# Patient Record
Sex: Female | Born: 2000 | Race: Black or African American | Hispanic: No | Marital: Single | Smoking: Never smoker
Health system: Southern US, Community
[De-identification: ages and names within clinical notes are randomized; demographics above are authoritative.]

## PROBLEM LIST (undated history)

## (undated) DIAGNOSIS — I340 Nonrheumatic mitral (valve) insufficiency: Secondary | ICD-10-CM

## (undated) DIAGNOSIS — I341 Nonrheumatic mitral (valve) prolapse: Secondary | ICD-10-CM

## (undated) HISTORY — DX: Nonrheumatic mitral (valve) prolapse: I34.1

## (undated) HISTORY — PX: OTHER SURGICAL HISTORY: SHX169

## (undated) HISTORY — DX: Nonrheumatic mitral (valve) insufficiency: I34.0

---
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
## 2019-05-09 ENCOUNTER — Ambulatory Visit (HOSPITAL_COMMUNITY)
Admission: EM | Admit: 2019-05-09 | Discharge: 2019-05-09 | Disposition: A | Discharge status: Home / Self Care | Payer: Medicaid Other | Attending: Emergency Medicine | Admitting: Emergency Medicine

## 2019-05-09 ENCOUNTER — Other Ambulatory Visit: Payer: Self-pay

## 2019-05-09 ENCOUNTER — Encounter (HOSPITAL_COMMUNITY): Payer: Self-pay

## 2019-05-09 DIAGNOSIS — Z20828 Contact with and (suspected) exposure to other viral communicable diseases: Secondary | ICD-10-CM

## 2019-05-09 DIAGNOSIS — Z20822 Contact with and (suspected) exposure to covid-19: Secondary | ICD-10-CM

## 2019-05-09 NOTE — ED Provider Notes (Signed)
[place]    CSN: [id number] Arrival date & time: 05/09/19  1403      History   Chief Complaint Chief Complaint  Patient presents with  . covid test    HPI Anita Salazar is a 18 y.o. female.   53 y old female presents with a complaint of being exposed to COVID-19.  Patient stated her boyfriend tested positive. Cannot recall the date her boyfriend tested positive.  She is asymptomatic and wants to be tested for COVID-1 9.   Denies sick exposure to  flu or strep.  Denies recent travel.  Denies aggravating or alleviating symptoms.  Denies previous COVID infection.   Denies fever, chills, fatigue, nasal congestion, rhinorrhea, sore throat, cough, SOB, wheezing, chest pain, nausea, vomiting, changes in bowel or bladder habits.    The history is provided by the patient. A language interpreter was used.    History reviewed. No pertinent past medical history.  There are no active problems to display for this patient.   History reviewed. No pertinent surgical history.  OB History   No obstetric history on file.      Home Medications    Prior to Admission medications   Medication Sig Start Date End Date Taking? Authorizing Provider  diphenhydrAMINE (BENADRYL) 25 MG tablet Take 1 tablet (25 mg total) by mouth every 6 (six) hours. Patient not taking: Reported on 12/28/2014 12/21/13   Louanne Skye, MD  ibuprofen (ADVIL,MOTRIN) 200 MG tablet Take 400 mg by mouth every 6 (six) hours as needed for headache, mild pain, moderate pain or cramping.    [provider]  nystatin-triamcinolone (MYCOLOG II) cream Apply to affected area daily Patient not taking: Reported on 11/10/2015 12/28/14   Antonietta Breach, PA-C  phenazopyridine (PYRIDIUM) 95 MG tablet Take 1 tablet (95 mg total) by mouth 3 (three) times daily as needed for pain. 03/03/17   Archer Asa, NP    Family History History reviewed. No pertinent family history.  Social History Social History    Tobacco Use  . Smoking status: Never Smoker  . Smokeless tobacco: Never Used  Substance Use Topics  . Alcohol use: No  . Drug use: No     Allergies   Patient has no known allergies.   Review of Systems Review of Systems  Constitutional: Negative for activity change, chills, fatigue and fever.  HENT: Negative for congestion, ear discharge, sinus pressure, sinus pain and sore throat.   Respiratory: Negative for cough, chest tightness, shortness of breath and wheezing.   Cardiovascular: Negative for chest pain and palpitations.  Gastrointestinal: Negative for constipation, diarrhea, nausea and vomiting.  Neurological: Negative for headaches.  ROS:All other are negatives Physical Exam Triage Vital Signs ED Triage Vitals  Enc Vitals Group     BP 05/09/19 1444 116/66     Pulse Rate 05/09/19 1444 78     Resp 05/09/19 1444 15     Temp 05/09/19 1444 98.4 F (36.9 C)     Temp Source 05/09/19 1444 Oral     SpO2 05/09/19 1444 100 %     Weight --      Height --      Head Circumference --      Peak Flow --      Pain Score 05/09/19 1441 0     Pain Loc --      Pain Edu? --      Excl. in [place]? --    No data found.  Updated Vital Signs  BP 116/66 (BP Location: Right Arm)   Pulse 78   Temp 98.4 F (36.9 C) (Oral)   Resp 15   SpO2 100%   Visual Acuity Right Eye Distance:   Left Eye Distance:   Bilateral Distance:    Right Eye Near:   Left Eye Near:    Bilateral Near:     Physical Exam Vitals signs and nursing note reviewed.  Constitutional:      General: She is not in acute distress.    Appearance: Normal appearance. She is normal weight. She is not ill-appearing.  HENT:     Head: Normocephalic.     Right Ear: Tympanic membrane, ear canal and external ear normal. There is no impacted cerumen.     Left Ear: Tympanic membrane, ear canal and external ear normal. There is no impacted cerumen.     Nose: No congestion.     Mouth/Throat:     Mouth: Mucous membranes are  moist.     Pharynx: No oropharyngeal exudate or posterior oropharyngeal erythema.  Cardiovascular:     Rate and Rhythm: Normal rate and regular rhythm.     Heart sounds: Normal heart sounds. No murmur.  Pulmonary:     Effort: Pulmonary effort is normal. No respiratory distress.     Breath sounds: Normal breath sounds. No wheezing.  Chest:     Chest wall: No tenderness.  Neurological:     Mental Status: She is alert.      UC Treatments / Results  Labs (all labs ordered are listed, but only abnormal results are displayed) Labs Reviewed  NOVEL CORONAVIRUS, NAA (HOSP ORDER, SEND-OUT TO REF LAB; TAT 18-24 HRS)    EKG   Radiology No results found.  Procedures Procedures (including critical care time)  Medications Ordered in UC Medications - No data to display  Initial Impression / Assessment and Plan / UC Course  I have reviewed the triage vital signs and the nursing notes.  Pertinent labs & imaging results that were available during my care of the patient were reviewed by me and considered in my medical decision making (see chart for details).    Patient is a symptomatic and stable at discharge.  COVID-19 test was ordered. Patient will be called when results become available and positive.To return to the urgent care if symptoms get worse. She was advised to follow-up with her primary care provider Final Clinical Impressions(s) / UC Diagnoses   Final diagnoses:  Exposure to COVID-19 virus     Discharge Instructions     COVID testing ordered.  It will take between 2-7 days for test results.  Someone will contact you regarding abnormal results.    In the meantime: You should remain isolated in your home for 10 days from symptom onset AND greater than 72 hours after symptoms resolution (absence of fever without the use of fever-reducing medication and improvement in respiratory symptoms), whichever is longer Get plenty of rest and push fluids Use medications daily for  symptom relief Use OTC medications like ibuprofen or tylenol as needed fever or pain Call or go to the ED if you have any new or worsening symptoms such as fever, worsening cough, shortness of breath, chest tightness, chest pain, turning blue, changes in mental status, etc...    ED Prescriptions    None     PDMP not reviewed this encounter.   Durward Parcel, FNP 05/09/19 1506

## 2019-05-09 NOTE — ED Triage Notes (Signed)
Pt presents to the UC for Covid test. Pt states her boyfriend is feeling sick and she is concern of him having Covid. Pt denies any signs and symptoms.

## 2019-05-09 NOTE — Discharge Instructions (Addendum)

## 2019-05-11 LAB — NOVEL CORONAVIRUS, NAA (HOSP ORDER, SEND-OUT TO REF LAB; TAT 18-24 HRS): SARS-CoV-2, NAA: NOT DETECTED

## 2019-12-04 DIAGNOSIS — Z419 Encounter for procedure for purposes other than remedying health state, unspecified: Secondary | ICD-10-CM | POA: Diagnosis not present

## 2020-01-04 DIAGNOSIS — Z419 Encounter for procedure for purposes other than remedying health state, unspecified: Secondary | ICD-10-CM | POA: Diagnosis not present

## 2020-02-04 DIAGNOSIS — Z419 Encounter for procedure for purposes other than remedying health state, unspecified: Secondary | ICD-10-CM | POA: Diagnosis not present

## 2020-02-06 ENCOUNTER — Other Ambulatory Visit: Payer: Self-pay

## 2020-02-06 ENCOUNTER — Encounter (HOSPITAL_COMMUNITY): Payer: Self-pay | Admitting: Emergency Medicine

## 2020-02-06 ENCOUNTER — Ambulatory Visit (HOSPITAL_COMMUNITY)
Admission: EM | Admit: 2020-02-06 | Discharge: 2020-02-06 | Disposition: A | Discharge status: Home / Self Care | Payer: Medicaid Other | Attending: Internal Medicine | Admitting: Internal Medicine

## 2020-02-06 DIAGNOSIS — Z3202 Encounter for pregnancy test, result negative: Secondary | ICD-10-CM | POA: Diagnosis not present

## 2020-02-06 DIAGNOSIS — R112 Nausea with vomiting, unspecified: Secondary | ICD-10-CM

## 2020-02-06 DIAGNOSIS — Z20822 Contact with and (suspected) exposure to covid-19: Secondary | ICD-10-CM | POA: Insufficient documentation

## 2020-02-06 LAB — POC URINE PREG, ED: Preg Test, Ur: NEGATIVE

## 2020-02-06 MED ORDER — ONDANSETRON HCL 4 MG PO TABS: 4.0000 mg | ORAL_TABLET | Freq: Four times a day (QID) | ORAL | 0 refills | Status: DC | PRN

## 2020-02-06 NOTE — ED Provider Notes (Signed)
[facility]    CSN: [id number] Arrival date & time: 02/06/20  1845      History   Chief Complaint Chief Complaint  Patient presents with  . Emesis    HPI Anita Salazar is a 19 y.o. female.   Patient presents with 1 episode of emesis today which occurred at lunchtime after she had eaten.  She states she was having a bowel movement when she felt hot and then vomited once.  She has been able to drink tea since then without emesis.  She denies fever, chills, sore throat, cough, shortness of breath, abdominal pain, diarrhea, or other symptoms.  She states she was sent home from work and is required to have a COVID test before she can return.  The history is provided by the patient.    History reviewed. No pertinent past medical history.  There are no problems to display for this patient.   History reviewed. No pertinent surgical history.  OB History   No obstetric history on file.      Home Medications    Prior to Admission medications   Medication Sig Start Date End Date Taking? Authorizing Provider  diphenhydrAMINE (BENADRYL) 25 MG tablet Take 1 tablet (25 mg total) by mouth every 6 (six) hours. Patient not taking: Reported on 12/28/2014 12/21/13   Niel Hummer, MD  ibuprofen (ADVIL,MOTRIN) 200 MG tablet Take 400 mg by mouth every 6 (six) hours as needed for headache, mild pain, moderate pain or cramping.    [provider]  nystatin-triamcinolone (MYCOLOG II) cream Apply to affected area daily Patient not taking: Reported on 11/10/2015 12/28/14   Antony Madura, PA-C  ondansetron (ZOFRAN) 4 MG tablet Take 1 tablet (4 mg total) by mouth every 6 (six) hours as needed for nausea or vomiting. 02/06/20   Mickie Bail, NP  phenazopyridine (PYRIDIUM) 95 MG tablet Take 1 tablet (95 mg total) by mouth 3 (three) times daily as needed for pain. 03/03/17   Cato Mulligan, NP    Family History History reviewed. No pertinent family history.  Social History Social  History   Tobacco Use  . Smoking status: Never Smoker  . Smokeless tobacco: Never Used  Substance Use Topics  . Alcohol use: No  . Drug use: No     Allergies   Patient has no known allergies.   Review of Systems Review of Systems  Constitutional: Negative for chills and fever.  HENT: Negative for ear pain and sore throat.   Eyes: Negative for pain and visual disturbance.  Respiratory: Negative for cough and shortness of breath.   Cardiovascular: Negative for chest pain and palpitations.  Gastrointestinal: Positive for nausea and vomiting. Negative for abdominal pain, constipation and diarrhea.  Genitourinary: Negative for dysuria and hematuria.  Musculoskeletal: Negative for arthralgias and back pain.  Skin: Negative for color change and rash.  Neurological: Negative for seizures and syncope.  All other systems reviewed and are negative.    Physical Exam Triage Vital Signs ED Triage Vitals  Enc Vitals Group     BP      Pulse      Resp      Temp      Temp src      SpO2      Weight      Height      Head Circumference      Peak Flow      Pain Score      Pain Loc  Pain Edu?      Excl. in [place]?    No data found.  Updated Vital Signs BP (!) 148/76 (BP Location: Right Arm)   Pulse 90   Temp 98.7 F (37.1 C) (Oral)   Resp 16   SpO2 100%   Visual Acuity Right Eye Distance:   Left Eye Distance:   Bilateral Distance:    Right Eye Near:   Left Eye Near:    Bilateral Near:     Physical Exam Vitals and nursing note reviewed.  Constitutional:      General: She is not in acute distress.    Appearance: She is well-developed. She is not ill-appearing.  HENT:     Head: Normocephalic and atraumatic.     Mouth/Throat:     Mouth: Mucous membranes are moist.     Pharynx: Oropharynx is clear.  Eyes:     Conjunctiva/sclera: Conjunctivae normal.  Cardiovascular:     Rate and Rhythm: Normal rate and regular rhythm.     Heart sounds: No murmur heard.    Pulmonary:     Effort: Pulmonary effort is normal. No respiratory distress.     Breath sounds: Normal breath sounds.  Abdominal:     General: Bowel sounds are normal.     Palpations: Abdomen is soft.     Tenderness: There is no abdominal tenderness. There is no right CVA tenderness, left CVA tenderness, guarding or rebound.  Musculoskeletal:     Cervical back: Neck supple.  Skin:    General: Skin is warm and dry.     Findings: No rash.  Neurological:     General: No focal deficit present.     Mental Status: She is alert and oriented to person, place, and time.     Gait: Gait normal.  Psychiatric:        Mood and Affect: Mood normal.        Behavior: Behavior normal.      UC Treatments / Results  Labs (all labs ordered are listed, but only abnormal results are displayed) Labs Reviewed  SARS CORONAVIRUS 2 (TAT 6-24 HRS)  POC URINE PREG, ED    EKG   Radiology No results found.  Procedures Procedures (including critical care time)  Medications Ordered in UC Medications - No data to display  Initial Impression / Assessment and Plan / UC Course  I have reviewed the triage vital signs and the nursing notes.  Pertinent labs & imaging results that were available during my care of the patient were reviewed by me and considered in my medical decision making (see chart for details).   Nausea with vomiting, not intractable.  Treating with Zofran.  Instructed patient to keep herself hydrated with clear liquids.  PCR COVID pending.  Instructed her to self quarantine until the test result is back.  Discussed that she should go to the ED if she has acute worsening symptoms and follow-up with her PCP as needed.  Patient agrees to plan of care.   Final Clinical Impressions(s) / UC Diagnoses   Final diagnoses:  Non-intractable vomiting with nausea, unspecified vomiting type     Discharge Instructions     Take the antinausea medication as directed.    Keep yourself  hydrated with clear liquids, such as water, Gatorade, Pedialyte, Sprite, or ginger ale.    Go to the emergency department if you have acute worsening symptoms.    Follow up with your primary care provider if your symptoms are not improving.  Your COVID test is pending.  You should self quarantine until the test result is back.         ED Prescriptions    Medication Sig Dispense Auth. Provider   ondansetron (ZOFRAN) 4 MG tablet Take 1 tablet (4 mg total) by mouth every 6 (six) hours as needed for nausea or vomiting. 12 tablet Mickie Bail, NP     PDMP not reviewed this encounter.   Mickie Bail, NP 02/06/20 2032

## 2020-02-06 NOTE — ED Triage Notes (Signed)
Patient presents to [facility] for assessment after she ate a honey bun and "noodles" for lunch today, felt like she needed to have a bowel movement, went to the bathroom and was having a bowel movement, got hot and felt bad and vomited.  States she gagged twice.  Denies any episodes since, denies abdominal pain since, denies URI symptoms.

## 2020-02-06 NOTE — Discharge Instructions (Signed)
Take the antinausea medication as directed.    Keep yourself hydrated with clear liquids, such as water, Gatorade, Pedialyte, Sprite, or ginger ale.    Go to the emergency department if you have acute worsening symptoms.    Follow up with your primary care provider if your symptoms are not improving.    Your COVID test is pending.  You should self quarantine until the test result is back.

## 2020-02-07 LAB — SARS CORONAVIRUS 2 (TAT 6-24 HRS): SARS Coronavirus 2: NEGATIVE

## 2020-03-05 DIAGNOSIS — Z419 Encounter for procedure for purposes other than remedying health state, unspecified: Secondary | ICD-10-CM | POA: Diagnosis not present

## 2020-04-05 DIAGNOSIS — Z419 Encounter for procedure for purposes other than remedying health state, unspecified: Secondary | ICD-10-CM | POA: Diagnosis not present

## 2020-05-05 DIAGNOSIS — Z419 Encounter for procedure for purposes other than remedying health state, unspecified: Secondary | ICD-10-CM | POA: Diagnosis not present

## 2020-06-05 DIAGNOSIS — Z419 Encounter for procedure for purposes other than remedying health state, unspecified: Secondary | ICD-10-CM | POA: Diagnosis not present

## 2020-07-06 DIAGNOSIS — Z419 Encounter for procedure for purposes other than remedying health state, unspecified: Secondary | ICD-10-CM | POA: Diagnosis not present

## 2020-08-03 DIAGNOSIS — Z419 Encounter for procedure for purposes other than remedying health state, unspecified: Secondary | ICD-10-CM | POA: Diagnosis not present

## 2020-08-26 ENCOUNTER — Other Ambulatory Visit: Payer: Self-pay

## 2020-08-26 ENCOUNTER — Ambulatory Visit (HOSPITAL_COMMUNITY)
Admission: EM | Admit: 2020-08-26 | Discharge: 2020-08-26 | Disposition: A | Discharge status: Home / Self Care | Payer: Medicaid Other

## 2020-08-26 DIAGNOSIS — Z20822 Contact with and (suspected) exposure to covid-19: Secondary | ICD-10-CM | POA: Diagnosis not present

## 2020-09-03 DIAGNOSIS — Z419 Encounter for procedure for purposes other than remedying health state, unspecified: Secondary | ICD-10-CM | POA: Diagnosis not present

## 2020-10-03 DIAGNOSIS — Z419 Encounter for procedure for purposes other than remedying health state, unspecified: Secondary | ICD-10-CM | POA: Diagnosis not present

## 2020-11-03 DIAGNOSIS — Z419 Encounter for procedure for purposes other than remedying health state, unspecified: Secondary | ICD-10-CM | POA: Diagnosis not present

## 2020-12-03 ENCOUNTER — Ambulatory Visit (HOSPITAL_COMMUNITY)
Admission: EM | Admit: 2020-12-03 | Discharge: 2020-12-03 | Disposition: A | Discharge status: Home / Self Care | Payer: Medicaid Other | Attending: Physician Assistant | Admitting: Physician Assistant

## 2020-12-03 ENCOUNTER — Other Ambulatory Visit: Payer: Self-pay

## 2020-12-03 ENCOUNTER — Encounter (HOSPITAL_COMMUNITY): Payer: Self-pay | Admitting: Emergency Medicine

## 2020-12-03 DIAGNOSIS — Z419 Encounter for procedure for purposes other than remedying health state, unspecified: Secondary | ICD-10-CM | POA: Diagnosis not present

## 2020-12-03 DIAGNOSIS — Z3202 Encounter for pregnancy test, result negative: Secondary | ICD-10-CM | POA: Diagnosis not present

## 2020-12-03 DIAGNOSIS — R519 Headache, unspecified: Secondary | ICD-10-CM

## 2020-12-03 LAB — POC URINE PREG, ED: Preg Test, Ur: NEGATIVE

## 2020-12-03 MED ORDER — KETOROLAC TROMETHAMINE 30 MG/ML IJ SOLN
INTRAMUSCULAR | Status: AC
  Filled 2020-12-03: qty 1

## 2020-12-03 MED ORDER — KETOROLAC TROMETHAMINE 30 MG/ML IJ SOLN
30.0000 mg | Freq: Once | INTRAMUSCULAR | Status: AC
  Administered 2020-12-03: 20:00:00 30 mg via INTRAMUSCULAR

## 2020-12-03 NOTE — Discharge Instructions (Addendum)
You are given Toradol today which is similar to ibuprofen.  Please do not take any ibuprofen, aspirin, naproxen for 24 hours.  You can use Tylenol for breakthrough pain.  Make sure you are drinking plenty of fluid and eat small frequent meals.  I would follow-up with your primary care provider and consider a referral to neurology if symptoms do not improve.  If you have any sudden worsening symptoms you need to go to the emergency room as we discussed.
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
## 2020-12-03 NOTE — ED Triage Notes (Signed)
PT reports daily headaches, but they have been worse for the last few days.   Needs a note to return to work tomorrow.   Denies sore throat, fever, congestion, cough. Has has a negative home covid test.

## 2021-01-03 DIAGNOSIS — Z419 Encounter for procedure for purposes other than remedying health state, unspecified: Secondary | ICD-10-CM | POA: Diagnosis not present

## 2021-02-03 DIAGNOSIS — Z419 Encounter for procedure for purposes other than remedying health state, unspecified: Secondary | ICD-10-CM | POA: Diagnosis not present

## 2021-03-05 DIAGNOSIS — Z419 Encounter for procedure for purposes other than remedying health state, unspecified: Secondary | ICD-10-CM | POA: Diagnosis not present

## 2021-04-05 DIAGNOSIS — Z419 Encounter for procedure for purposes other than remedying health state, unspecified: Secondary | ICD-10-CM | POA: Diagnosis not present

## 2021-05-05 DIAGNOSIS — Z419 Encounter for procedure for purposes other than remedying health state, unspecified: Secondary | ICD-10-CM | POA: Diagnosis not present

## 2021-06-01 ENCOUNTER — Ambulatory Visit (HOSPITAL_COMMUNITY)
Admission: EM | Admit: 2021-06-01 | Discharge: 2021-06-01 | Disposition: A | Discharge status: Home / Self Care | Payer: Medicaid Other | Attending: Urgent Care | Admitting: Urgent Care

## 2021-06-01 ENCOUNTER — Other Ambulatory Visit: Payer: Self-pay

## 2021-06-01 ENCOUNTER — Encounter (HOSPITAL_COMMUNITY): Payer: Self-pay

## 2021-06-01 DIAGNOSIS — J029 Acute pharyngitis, unspecified: Secondary | ICD-10-CM | POA: Diagnosis present

## 2021-06-01 DIAGNOSIS — R6889 Other general symptoms and signs: Secondary | ICD-10-CM | POA: Diagnosis not present

## 2021-06-01 DIAGNOSIS — J039 Acute tonsillitis, unspecified: Secondary | ICD-10-CM | POA: Diagnosis not present

## 2021-06-01 DIAGNOSIS — B349 Viral infection, unspecified: Secondary | ICD-10-CM | POA: Insufficient documentation

## 2021-06-01 DIAGNOSIS — Z20822 Contact with and (suspected) exposure to covid-19: Secondary | ICD-10-CM | POA: Insufficient documentation

## 2021-06-01 DIAGNOSIS — J111 Influenza due to unidentified influenza virus with other respiratory manifestations: Secondary | ICD-10-CM

## 2021-06-01 LAB — POCT RAPID STREP A, ED / UC: Streptococcus, Group A Screen (Direct): NEGATIVE

## 2021-06-01 LAB — POC INFLUENZA A AND B ANTIGEN (URGENT CARE ONLY)
INFLUENZA A ANTIGEN, POC: NEGATIVE
INFLUENZA B ANTIGEN, POC: NEGATIVE

## 2021-06-01 MED ORDER — AZITHROMYCIN 250 MG PO TABS: 250.0000 mg | ORAL_TABLET | Freq: Every day | ORAL | 0 refills | Status: DC

## 2021-06-01 NOTE — ED Triage Notes (Signed)
Pt presents to the office for body aches,cough and congestion x 4 days.

## 2021-06-01 NOTE — ED Provider Notes (Signed)
[facility]    CSN: [id number] Arrival date & time: 06/01/21  1234      History   Chief Complaint Chief Complaint  Patient presents with   Fever   Cough   Sore Throat    Body aches      HPI Anita Salazar is a 20 y.o. female.   Pleasant 20 year old female presents with complaint of sore throat, right ear pain, fever and body aches.  She states she started out feeling mildly ill on Christmas day, but it has progressively gotten worse.  She states her worst day is today.  She did not check her temperature last evening but states she felt she had a high fever.  She reports severe pain with swallowing, primarily on the right side.  She also states that when she swallows there is a popping pressure in her R ear. She has tried OTC tylenol only for her symptoms. Denies headache, cough, nausea or vomiting. No rash.   Fever Associated symptoms: ear pain, myalgias and sore throat   Associated symptoms: no cough   Cough Associated symptoms: ear pain, fever, myalgias and sore throat   Sore Throat   History reviewed. No pertinent past medical history.  There are no problems to display for this patient.   History reviewed. No pertinent surgical history.  OB History   No obstetric history on file.      Home Medications    Prior to Admission medications   Medication Sig Start Date End Date Taking? Authorizing Provider  azithromycin (ZITHROMAX) 250 MG tablet Take 1 tablet (250 mg total) by mouth daily. Take first 2 tablets together, then 1 every day until finished. 06/01/21  Yes Aileen Amore L, PA  diphenhydrAMINE (BENADRYL) 25 MG tablet Take 1 tablet (25 mg total) by mouth every 6 (six) hours. Patient not taking: No sig reported 12/21/13   Niel Hummer, MD  ibuprofen (ADVIL,MOTRIN) 200 MG tablet Take 400 mg by mouth every 6 (six) hours as needed for headache, mild pain, moderate pain or cramping.    [provider]  nystatin-triamcinolone (MYCOLOG II)  cream Apply to affected area daily Patient not taking: No sig reported 12/28/14   Antony Madura, PA-C  ondansetron (ZOFRAN) 4 MG tablet Take 1 tablet (4 mg total) by mouth every 6 (six) hours as needed for nausea or vomiting. 02/06/20   Mickie Bail, NP  phenazopyridine (PYRIDIUM) 95 MG tablet Take 1 tablet (95 mg total) by mouth 3 (three) times daily as needed for pain. 03/03/17   Cato Mulligan, NP    Family History History reviewed. No pertinent family history.  Social History Social History   Tobacco Use   Smoking status: Never   Smokeless tobacco: Never  Substance Use Topics   Alcohol use: No   Drug use: No     Allergies   Patient has no known allergies.   Review of Systems Review of Systems  Constitutional:  Positive for fever.  HENT:  Positive for ear pain and sore throat.   Respiratory:  Negative for cough.   Musculoskeletal:  Positive for myalgias.    Physical Exam Triage Vital Signs ED Triage Vitals  Enc Vitals Group     BP 06/01/21 1624 (!) 144/90     Pulse Rate 06/01/21 1624 94     Resp 06/01/21 1624 18     Temp 06/01/21 1624 100.3 F (37.9 C)     Temp Source 06/01/21 1624 Oral     SpO2  06/01/21 1624 100 %     Weight --      Height --      Head Circumference --      Peak Flow --      Pain Score 06/01/21 1625 10     Pain Loc --      Pain Edu? --      Excl. in GC? --    No data found.  Updated Vital Signs BP (!) 144/90 (BP Location: Left Arm)    Pulse 94    Temp 100.3 F (37.9 C) (Oral)    Resp 18    LMP 05/14/2021 (Approximate)    SpO2 100%   Visual Acuity Right Eye Distance:   Left Eye Distance:   Bilateral Distance:    Right Eye Near:   Left Eye Near:    Bilateral Near:     Physical Exam Vitals and nursing note reviewed.  Constitutional:      General: She is not in acute distress.    Appearance: She is well-developed. She is obese. She is ill-appearing. She is not toxic-appearing or diaphoretic.  HENT:     Head: Normocephalic and  atraumatic.     Right Ear: Tympanic membrane, ear canal and external ear normal. No drainage, swelling or tenderness. No middle ear effusion. There is no impacted cerumen. Tympanic membrane is not erythematous.     Left Ear: Tympanic membrane, ear canal and external ear normal. No drainage, swelling or tenderness.  No middle ear effusion. There is no impacted cerumen. Tympanic membrane is not erythematous.     Ears:     Comments: Pain with palpation of pre-auricular lymph node    Nose: Nose normal. No congestion or rhinorrhea.     Mouth/Throat:     Mouth: Mucous membranes are moist. No oral lesions.     Pharynx: No oropharyngeal exudate, posterior oropharyngeal erythema or uvula swelling.     Tonsils: Tonsillar exudate (on R with thick post nasal drainage on R only) present.  Eyes:     General: No scleral icterus.       Right eye: No discharge.        Left eye: No discharge.     Extraocular Movements: Extraocular movements intact.     Conjunctiva/sclera: Conjunctivae normal.     Pupils: Pupils are equal, round, and reactive to light.  Neck:     Thyroid: No thyromegaly.  Cardiovascular:     Rate and Rhythm: Normal rate and regular rhythm.     Pulses: Normal pulses.     Heart sounds: Murmur (R and L #2 ICS - chronic per pt) heard.    No gallop.  Pulmonary:     Effort: Pulmonary effort is normal. No respiratory distress.     Breath sounds: Normal breath sounds. No stridor. No wheezing, rhonchi or rales.  Chest:     Chest wall: No tenderness.  Abdominal:     Palpations: Abdomen is soft.     Tenderness: There is no abdominal tenderness.  Musculoskeletal:        General: No swelling or tenderness. Normal range of motion.     Cervical back: Normal range of motion and neck supple. No tenderness.     Right lower leg: No edema.     Left lower leg: No edema.  Lymphadenopathy:     Cervical: No cervical adenopathy.  Skin:    General: Skin is warm and dry.     Capillary Refill: Capillary  refill takes less than  2 seconds.  Neurological:     Mental Status: She is alert.  Psychiatric:        Mood and Affect: Mood normal.     UC Treatments / Results  Labs (all labs ordered are listed, but only abnormal results are displayed) Labs Reviewed  CULTURE, GROUP A STREP ([facility])  SARS CORONAVIRUS 2 (TAT 6-24 HRS)  POCT RAPID STREP A, ED / UC  POC INFLUENZA A AND B ANTIGEN (URGENT CARE ONLY)    EKG   Radiology No results found.  Procedures Procedures (including critical care time)  Medications Ordered in UC Medications - No data to display  Initial Impression / Assessment and Plan / UC Course  I have reviewed the triage vital signs and the nursing notes.  Pertinent labs & imaging results that were available during my care of the patient were reviewed by me and considered in my medical decision making (see chart for details).     Tonsillitis - start azithromycin. Strep testing negative, await throat culture. Viral illness - flu test negative. Await results of covid test. Alternate ibuprofen/ tylenol and increase water intake.   Final Clinical Impressions(s) / UC Diagnoses   Final diagnoses:  Acute tonsillitis, unspecified etiology  Influenza-like illness     Discharge Instructions      Your flu swab and strep test were negative. We will obtain a throat culture and covid test as well, and call with results of the test. Please start taking azithromycin for suspected tonsillitis. Eat yogurt daily to prevent side effects. Increase your water intake.  Alternate tylenol and ibuprofen for fever reduction and body aches.     ED Prescriptions     Medication Sig Dispense Auth. Provider   azithromycin (ZITHROMAX) 250 MG tablet Take 1 tablet (250 mg total) by mouth daily. Take first 2 tablets together, then 1 every day until finished. 6 tablet Jorje Vanatta L, [place]      PDMP not reviewed this encounter.   Maretta Bees, [place] 06/01/21 1706

## 2021-06-01 NOTE — Discharge Instructions (Addendum)
Your flu swab and strep test were negative. We will obtain a throat culture and covid test as well, and call with results of the test. Please start taking azithromycin for suspected tonsillitis. Eat yogurt daily to prevent side effects. Increase your water intake.  Alternate tylenol and ibuprofen for fever reduction and body aches.

## 2021-06-02 LAB — SARS CORONAVIRUS 2 (TAT 6-24 HRS): SARS Coronavirus 2: NEGATIVE

## 2021-06-04 LAB — CULTURE, GROUP A STREP (THRC)

## 2021-06-05 DIAGNOSIS — Z419 Encounter for procedure for purposes other than remedying health state, unspecified: Secondary | ICD-10-CM | POA: Diagnosis not present

## 2021-07-06 DIAGNOSIS — Z419 Encounter for procedure for purposes other than remedying health state, unspecified: Secondary | ICD-10-CM | POA: Diagnosis not present

## 2021-08-03 DIAGNOSIS — Z419 Encounter for procedure for purposes other than remedying health state, unspecified: Secondary | ICD-10-CM | POA: Diagnosis not present

## 2021-09-03 DIAGNOSIS — Z419 Encounter for procedure for purposes other than remedying health state, unspecified: Secondary | ICD-10-CM | POA: Diagnosis not present

## 2021-10-03 DIAGNOSIS — Z419 Encounter for procedure for purposes other than remedying health state, unspecified: Secondary | ICD-10-CM | POA: Diagnosis not present

## 2021-11-03 DIAGNOSIS — Z419 Encounter for procedure for purposes other than remedying health state, unspecified: Secondary | ICD-10-CM | POA: Diagnosis not present

## 2021-12-03 DIAGNOSIS — Z419 Encounter for procedure for purposes other than remedying health state, unspecified: Secondary | ICD-10-CM | POA: Diagnosis not present

## 2021-12-15 ENCOUNTER — Ambulatory Visit (INDEPENDENT_AMBULATORY_CARE_PROVIDER_SITE_OTHER): Payer: Medicaid Other | Admitting: Student

## 2021-12-15 ENCOUNTER — Encounter: Payer: Self-pay | Admitting: Student

## 2021-12-15 VITALS — BP 111/67 | HR 88 | Ht 68.5 in | Wt 109.0 lb

## 2021-12-15 DIAGNOSIS — Z789 Other specified health status: Secondary | ICD-10-CM | POA: Diagnosis not present

## 2021-12-15 DIAGNOSIS — F909 Attention-deficit hyperactivity disorder, unspecified type: Secondary | ICD-10-CM

## 2021-12-15 DIAGNOSIS — N926 Irregular menstruation, unspecified: Secondary | ICD-10-CM

## 2021-12-15 DIAGNOSIS — R6250 Unspecified lack of expected normal physiological development in childhood: Secondary | ICD-10-CM | POA: Diagnosis not present

## 2021-12-15 DIAGNOSIS — M542 Cervicalgia: Secondary | ICD-10-CM | POA: Diagnosis not present

## 2021-12-15 DIAGNOSIS — I340 Nonrheumatic mitral (valve) insufficiency: Secondary | ICD-10-CM

## 2021-12-15 DIAGNOSIS — Z952 Presence of prosthetic heart valve: Secondary | ICD-10-CM | POA: Insufficient documentation

## 2021-12-15 LAB — POCT URINE PREGNANCY: Preg Test, Ur: NEGATIVE

## 2021-12-15 MED ORDER — TIZANIDINE HCL 2 MG PO CAPS: 2.0000 mg | ORAL_CAPSULE | Freq: Three times a day (TID) | ORAL | 0 refills | Status: DC

## 2021-12-15 NOTE — Assessment & Plan Note (Signed)
Echo order placed.  Referral to cardiology.
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
## 2021-12-15 NOTE — Assessment & Plan Note (Signed)
Alternate with Tylenol and ibuprofen for pain management, use a heating pad. Prescription of 2 mg Zanaflex sent to pharmacy.

## 2021-12-15 NOTE — Assessment & Plan Note (Signed)
>>  ASSESSMENT AND PLAN FOR MITRAL VALVE INSUFFICIENCY WRITTEN ON 12/15/2021  3:59 PM BY MILLER, EMILY, DO  Echo order placed.  Referral to cardiology.

## 2021-12-15 NOTE — Assessment & Plan Note (Signed)
Patient is actively trying to get pregnant.  Urine pregnancy test in the office resulted negative.  Follow-up with OB/GYN and cardiology.

## 2021-12-15 NOTE — Patient Instructions (Addendum)
It was great to see you today! Thank you for choosing [facility] for your primary care. Anita Salazar was seen for new patient establishing care visit.  Today we addressed: Neck and back pain: I have sent in a prescription for Zanaflex.  This medication can make you drowsy and sleepy, please be cautious and do not drive or operate machinery while using this medication.  Use Tylenol and ibuprofen during the day with a heating pad.  And take the muscle relaxer-Zanaflex at night. Mitral valve insufficiency: I have sent in a referral to cardiology, if you have trouble getting transportation to this office please call us and let us know.  I have also sent an order for an echo of your heart.  You should be getting a call for this study soon. Trying to get pregnant: Your urine pregnancy test in the office today was negative.  Please follow-up with your OB/GYN prior to getting pregnant.  I encourage you to use prenatal vitamins.  If you haven't already, sign up for My Chart to have easy access to your labs results, and communication with your primary care physician.  You should return to our clinic Return in about 3 months (around 03/17/2022).  Please arrive 15 minutes before your appointment to ensure smooth check in process.  We appreciate your efforts in making this happen.  Please call the clinic at [phone] if your symptoms worsen or you have any concerns.  Thank you for allowing me to participate in your care, Dr. Glendale Chard

## 2021-12-27 ENCOUNTER — Ambulatory Visit (HOSPITAL_COMMUNITY): Payer: Medicaid Other | Attending: Internal Medicine

## 2021-12-27 DIAGNOSIS — I340 Nonrheumatic mitral (valve) insufficiency: Secondary | ICD-10-CM | POA: Diagnosis not present

## 2021-12-27 LAB — ECHOCARDIOGRAM COMPLETE
Area-P 1/2: 3.6 cm2
MV M vel: 5.39 m/s
MV Peak grad: 116 mmHg
S' Lateral: 3.1 cm

## 2021-12-28 ENCOUNTER — Telehealth: Payer: Self-pay | Admitting: Student

## 2021-12-28 NOTE — Telephone Encounter (Signed)
Explained results to patient about worsening mitral valve insufficiency. She is following up with Cardiology on 8/7. Patient encouraged to go to appointment. if she has problems with transportation she will call and let us know. 

## 2021-12-28 NOTE — Telephone Encounter (Deleted)
Explained results to patient about worsening mitral valve insufficiency. She is following up with Cardiology on 8/7. Patient encouraged to go to appointment. if she has problems with transportation she will call and let us know.

## 2022-01-03 DIAGNOSIS — Z419 Encounter for procedure for purposes other than remedying health state, unspecified: Secondary | ICD-10-CM | POA: Diagnosis not present

## 2022-01-09 ENCOUNTER — Ambulatory Visit: Payer: Medicaid Other | Admitting: Internal Medicine

## 2022-01-09 NOTE — Progress Notes (Deleted)
Cardiology Office Note:    Date:  01/09/2022   ID:  MORGAINE KIMBALL, DOB 25-Oct-2000, MRN [id number]  PCP:  Glendale Chard, DO   [facility] Providers Cardiologist:  None { Click to update primary MD,subspecialty MD or APP then REFRESH:1}    Referring MD: Glendale Chard, DO   No chief complaint on file. ***  History of Present Illness:    Anita Salazar is a 21 y.o. female with a hx of anterior leaflet mitral valve prolapse    She is aiming to get pregnant  Current Medications: No outpatient medications have been marked as taking for the 01/09/22 encounter (Appointment) with Maisie Fus, MD.     Allergies:   Patient has no known allergies.   Social History   Socioeconomic History   Marital status: Single    Spouse name: Not on file   Number of children: Not on file   Years of education: Not on file   Highest education level: Not on file  Occupational History   Not on file  Tobacco Use   Smoking status: Never   Smokeless tobacco: Never  Substance and Sexual Activity   Alcohol use: No   Drug use: No   Sexual activity: Not on file  Other Topics Concern   Not on file  Social History Narrative   Not on file   Social Determinants of Health   Financial Resource Strain: Not on file  Food Insecurity: Not on file  Transportation Needs: Not on file  Physical Activity: Not on file  Stress: Not on file  Social Connections: Not on file     Family History: The patient's ***family history is not on file.  ROS:   Please see the history of present illness.    *** All other systems reviewed and are negative.  EKGs/Labs/Other Studies Reviewed:    The following studies were reviewed today: ***  EKG:  EKG is *** ordered today.  The ekg ordered today demonstrates ***  Recent Labs: No results found for requested labs within last 365 days.  Recent Lipid Panel No results found for: "CHOL", "TRIG", "HDL", "CHOLHDL", "VLDL", "LDLCALC",  "LDLDIRECT"   Risk Assessment/Calculations:   {Does this patient have ATRIAL FIBRILLATION?:[id number]}       Physical Exam:    VS:  LMP 11/14/2021 (Exact Date)     Wt Readings from Last 3 Encounters:  12/15/21 109 lb (49.4 kg)  04/19/17 98 lb 8.7 oz (44.7 kg) (4 %, Z= -1.70)*  03/03/17 96 lb 12.5 oz (43.9 kg) (3 %, Z= -1.85)*   * Growth percentiles are based on CDC (Girls, 2-20 Years) data.     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 Appears to be moderate. Had blunting of the PV. She is *** symptomatic. Will plan for TEE to better assess her valve and severity.   PLAN:    In order of problems listed above:  Referral to the woman's clinic      {Are you ordering a CV Procedure (e.g. stress test, cath, DCCV, TEE, etc)?  Press F2        :[id number]}    Medication Adjustments/Labs and Tests Ordered: Current medicines are reviewed at length with the patient today.  Concerns regarding medicines are outlined above.  No orders of the defined types were placed in this encounter.  No orders of the defined types were placed in this encounter.   There are no Patient Instructions on file for this visit.   Signed, Maisie Fus, MD  01/09/2022 9:57 AM    [facility]

## 2022-01-12 NOTE — Progress Notes (Deleted)
Cardiology Office Note:   Date:  01/12/2022  NAME:  Anita Salazar    MRN: [id number] DOB:  2000/09/21   PCP:  Anita Chard, DO  Cardiologist:  None  Electrophysiologist:  None   Referring MD: Anita Chard, DO   No chief complaint on file. ***  History of Present Illness:   Anita Salazar is a 21 y.o. female with a hx of MV regurgitation who is being seen today for the evaluation of mitral regurgitation at the request of Anita Chard, DO.  Past Medical History: No past medical history on file.  Past Surgical History: No past surgical history on file.  Current Medications: No outpatient medications have been marked as taking for the 01/13/22 encounter (Appointment) with O'Neal, Ronnald Ramp, MD.     Allergies:    Patient has no known allergies.   Social History: Social History   Socioeconomic History   Marital status: Single    Spouse name: Not on file   Number of children: Not on file   Years of education: Not on file   Highest education level: Not on file  Occupational History   Not on file  Tobacco Use   Smoking status: Never   Smokeless tobacco: Never  Substance and Sexual Activity   Alcohol use: No   Drug use: No   Sexual activity: Not on file  Other Topics Concern   Not on file  Social History Narrative   Not on file   Social Determinants of Health   Financial Resource Strain: Not on file  Food Insecurity: Not on file  Transportation Needs: Not on file  Physical Activity: Not on file  Stress: Not on file  Social Connections: Not on file     Family History: The patient's ***family history is not on file.  ROS:   All other ROS reviewed and negative. Pertinent positives noted in the HPI.     EKGs/Labs/Other Studies Reviewed:   The following studies were personally reviewed by me today:  EKG:  EKG is *** ordered today.  The ekg ordered today demonstrates ***, and was personally reviewed by me.   TTE 12/15/2021  1. The mitral valve is  abnormal-mitral valve prolapse with at least  moderate, eccentric mitral valve regurgitation. No evidence of mitral  stenosis. Right sided blunting of pulmonary vein flow.   2. Left ventricular ejection fraction, by estimation, is 60 to 65%. The  left ventricle has normal function. The left ventricle has no regional  wall motion abnormalities. Left ventricular diastolic parameters were  normal. The average left ventricular  global longitudinal strain is -23.7 %. The global longitudinal strain is  normal.   3. Right ventricular systolic function is normal. The right ventricular  size is normal. There is normal pulmonary artery systolic pressure.   4. The aortic valve is tricuspid. Aortic valve regurgitation is not  visualized. No aortic stenosis is present.   Recent Labs: No results found for requested labs within last 365 days.   Recent Lipid Panel No results found for: "CHOL", "TRIG", "HDL", "CHOLHDL", "VLDL", "LDLCALC", "LDLDIRECT"  Physical Exam:   VS:  LMP 11/14/2021 (Exact Date)    Wt Readings from Last 3 Encounters:  12/15/21 109 lb (49.4 kg)  04/19/17 98 lb 8.7 oz (44.7 kg) (4 %, Z= -1.70)*  03/03/17 96 lb 12.5 oz (43.9 kg) (3 %, Z= -1.85)*   * Growth percentiles are based on CDC (Girls, 2-20 Years) data.    General: Well nourished, well developed,  in no acute distress Head: Atraumatic, normal size  Eyes: PEERLA, EOMI  Neck: Supple, no JVD Endocrine: No thryomegaly Cardiac: Normal S1, S2; RRR; no murmurs, rubs, or gallops Lungs: Clear to auscultation bilaterally, no wheezing, rhonchi or rales  Abd: Soft, nontender, no hepatomegaly  Ext: No edema, pulses 2+ Musculoskeletal: No deformities, BUE and BLE strength normal and equal Skin: Warm and dry, no rashes   Neuro: Alert and oriented to person, place, time, and situation, CNII-XII grossly intact, no focal deficits  Psych: Normal mood and affect   ASSESSMENT:   Anita Salazar is a 21 y.o. female who presents for  the following: No diagnosis found.  PLAN:   There are no diagnoses linked to this encounter.  {Are you ordering a CV Procedure (e.g. stress test, cath, DCCV, TEE, etc)?   Press F2        :[id number]}  Disposition: No follow-ups on file.  Medication Adjustments/Labs and Tests Ordered: Current medicines are reviewed at length with the patient today.  Concerns regarding medicines are outlined above.  No orders of the defined types were placed in this encounter.  No orders of the defined types were placed in this encounter.   There are no Patient Instructions on file for this visit.   Time Spent with Patient: I have spent a total of *** minutes with patient reviewing hospital notes, telemetry, EKGs, labs and examining the patient as well as establishing an assessment and plan that was discussed with the patient.  > 50% of time was spent in direct patient care.  Signed, Lenna Gilford. Flora Lipps, MD, [facility]  [facility]  [address] [place], [place] [address] [phone]  01/12/2022 7:53 PM

## 2022-01-13 ENCOUNTER — Ambulatory Visit: Payer: Medicaid Other | Admitting: Cardiovascular Disease

## 2022-01-13 DIAGNOSIS — I340 Nonrheumatic mitral (valve) insufficiency: Secondary | ICD-10-CM

## 2022-02-03 DIAGNOSIS — Z419 Encounter for procedure for purposes other than remedying health state, unspecified: Secondary | ICD-10-CM | POA: Diagnosis not present

## 2022-02-06 NOTE — Progress Notes (Deleted)
Cardiology Office Note:   Date:  02/06/2022  NAME:  Anita Salazar    MRN: [id number] DOB:  03/29/2001   PCP:  Glendale Chard, DO  Cardiologist:  None  Electrophysiologist:  None   Referring MD: Glendale Chard, DO   No chief complaint on file. ***  History of Present Illness:   Anita Salazar is a 21 y.o. female with a hx of MVP who is being seen today for the evaluation of MVP/MR at the request of Glendale Chard, DO.  Problem List Mitral valve regurgitation/prolapse  -mod to severe   Past Medical History: No past medical history on file.  Past Surgical History: No past surgical history on file.  Current Medications: No outpatient medications have been marked as taking for the 02/09/22 encounter (Appointment) with O'Neal, Ronnald Ramp, MD.     Allergies:    Patient has no known allergies.   Social History: Social History   Socioeconomic History   Marital status: Single    Spouse name: Not on file   Number of children: Not on file   Years of education: Not on file   Highest education level: Not on file  Occupational History   Not on file  Tobacco Use   Smoking status: Never   Smokeless tobacco: Never  Substance and Sexual Activity   Alcohol use: No   Drug use: No   Sexual activity: Not on file  Other Topics Concern   Not on file  Social History Narrative   Not on file   Social Determinants of Health   Financial Resource Strain: Not on file  Food Insecurity: Not on file  Transportation Needs: Not on file  Physical Activity: Not on file  Stress: Not on file  Social Connections: Not on file     Family History: The patient's ***family history is not on file.  ROS:   All other ROS reviewed and negative. Pertinent positives noted in the HPI.     EKGs/Labs/Other Studies Reviewed:   The following studies were personally reviewed by me today:  EKG:  EKG is *** ordered today.  The ekg ordered today demonstrates ***, and was personally reviewed by me.    TTE 12/27/2021  1. The mitral valve is abnormal-mitral valve prolapse with at least  moderate, eccentric mitral valve regurgitation. No evidence of mitral  stenosis. Right sided blunting of pulmonary vein flow.   2. Left ventricular ejection fraction, by estimation, is 60 to 65%. The  left ventricle has normal function. The left ventricle has no regional  wall motion abnormalities. Left ventricular diastolic parameters were  normal. The average left ventricular  global longitudinal strain is -23.7 %. The global longitudinal strain is  normal.   3. Right ventricular systolic function is normal. The right ventricular  size is normal. There is normal pulmonary artery systolic pressure.   4. The aortic valve is tricuspid. Aortic valve regurgitation is not  visualized. No aortic stenosis is present.   Recent Labs: No results found for requested labs within last 365 days.   Recent Lipid Panel No results found for: "CHOL", "TRIG", "HDL", "CHOLHDL", "VLDL", "LDLCALC", "LDLDIRECT"  Physical Exam:   VS:  There were no vitals taken for this visit.   Wt Readings from Last 3 Encounters:  12/15/21 109 lb (49.4 kg)  04/19/17 98 lb 8.7 oz (44.7 kg) (4 %, Z= -1.70)*  03/03/17 96 lb 12.5 oz (43.9 kg) (3 %, Z= -1.85)*   * Growth percentiles are based on CDC (  Girls, 2-20 Years) data.    General: Well nourished, well developed, in no acute distress Head: Atraumatic, normal size  Eyes: PEERLA, EOMI  Neck: Supple, no JVD Endocrine: No thryomegaly Cardiac: Normal S1, S2; RRR; no murmurs, rubs, or gallops Lungs: Clear to auscultation bilaterally, no wheezing, rhonchi or rales  Abd: Soft, nontender, no hepatomegaly  Ext: No edema, pulses 2+ Musculoskeletal: No deformities, BUE and BLE strength normal and equal Skin: Warm and dry, no rashes   Neuro: Alert and oriented to person, place, time, and situation, CNII-XII grossly intact, no focal deficits  Psych: Normal mood and affect   ASSESSMENT:    Anita Salazar is a 21 y.o. female who presents for the following: No diagnosis found.  PLAN:   There are no diagnoses linked to this encounter.  {Are you ordering a CV Procedure (e.g. stress test, cath, DCCV, TEE, etc)?   Press F2        :[id number]}  Disposition: No follow-ups on file.  Medication Adjustments/Labs and Tests Ordered: Current medicines are reviewed at length with the patient today.  Concerns regarding medicines are outlined above.  No orders of the defined types were placed in this encounter.  No orders of the defined types were placed in this encounter.   There are no Patient Instructions on file for this visit.   Time Spent with Patient: I have spent a total of *** minutes with patient reviewing hospital notes, telemetry, EKGs, labs and examining the patient as well as establishing an assessment and plan that was discussed with the patient.  > 50% of time was spent in direct patient care.  Signed, Lenna Gilford. Flora Lipps, MD, [facility]  [facility]  [address], [address] [place], [place] [address] [phone]  02/06/2022 8:17 PM

## 2022-02-09 ENCOUNTER — Ambulatory Visit: Payer: Medicaid Other | Admitting: Cardiovascular Disease

## 2022-02-09 DIAGNOSIS — I340 Nonrheumatic mitral (valve) insufficiency: Secondary | ICD-10-CM

## 2022-03-05 DIAGNOSIS — Z419 Encounter for procedure for purposes other than remedying health state, unspecified: Secondary | ICD-10-CM | POA: Diagnosis not present

## 2022-03-22 NOTE — Progress Notes (Deleted)
Cardiology Office Note:   Date:  03/22/2022  NAME:  Anita Salazar    MRN: [id number] DOB:  05-26-2001   PCP:  Darci Current, DO  Cardiologist:  None  Electrophysiologist:  None   Referring MD: Darci Current, DO   No chief complaint on file. ***  History of Present Illness:   Anita Salazar is a 21 y.o. female with a hx of mitral valve prolapse who is being seen today for the evaluation of mitral valve prolapse/regurgitation at the request of Darci Current, DO.  Problem List MV prolapse/regurgitation  Past Medical History: No past medical history on file.  Past Surgical History: No past surgical history on file.  Current Medications: No outpatient medications have been marked as taking for the 03/23/22 encounter (Appointment) with O'Neal, Cassie Freer, MD.     Allergies:    Patient has no known allergies.   Social History: Social History   Socioeconomic History   Marital status: Single    Spouse name: Not on file   Number of children: Not on file   Years of education: Not on file   Highest education level: Not on file  Occupational History   Not on file  Tobacco Use   Smoking status: Never   Smokeless tobacco: Never  Substance and Sexual Activity   Alcohol use: No   Drug use: No   Sexual activity: Not on file  Other Topics Concern   Not on file  Social History Narrative   Not on file   Social Determinants of Health   Financial Resource Strain: Not on file  Food Insecurity: Not on file  Transportation Needs: Not on file  Physical Activity: Not on file  Stress: Not on file  Social Connections: Not on file     Family History: The patient's ***family history is not on file.  ROS:   All other ROS reviewed and negative. Pertinent positives noted in the HPI.     EKGs/Labs/Other Studies Reviewed:   The following studies were personally reviewed by me today:  EKG:  EKG is *** ordered today.  The ekg ordered today demonstrates ***, and was  personally reviewed by me.   TTE 12/27/2021  1. The mitral valve is abnormal-mitral valve prolapse with at least  moderate, eccentric mitral valve regurgitation. No evidence of mitral  stenosis. Right sided blunting of pulmonary vein flow.   2. Left ventricular ejection fraction, by estimation, is 60 to 65%. The  left ventricle has normal function. The left ventricle has no regional  wall motion abnormalities. Left ventricular diastolic parameters were  normal. The average left ventricular  global longitudinal strain is -23.7 %. The global longitudinal strain is  normal.   3. Right ventricular systolic function is normal. The right ventricular  size is normal. There is normal pulmonary artery systolic pressure.   4. The aortic valve is tricuspid. Aortic valve regurgitation is not  visualized. No aortic stenosis is present.   Recent Labs: No results found for requested labs within last 365 days.   Recent Lipid Panel No results found for: "CHOL", "TRIG", "HDL", "CHOLHDL", "VLDL", "LDLCALC", "LDLDIRECT"  Physical Exam:   VS:  There were no vitals taken for this visit.   Wt Readings from Last 3 Encounters:  12/15/21 109 lb (49.4 kg)  04/19/17 98 lb 8.7 oz (44.7 kg) (4 %, Z= -1.70)*  03/03/17 96 lb 12.5 oz (43.9 kg) (3 %, Z= -1.85)*   * Growth percentiles are based on CDC (Girls, 2-20  Years) data.    General: Well nourished, well developed, in no acute distress Head: Atraumatic, normal size  Eyes: PEERLA, EOMI  Neck: Supple, no JVD Endocrine: No thryomegaly Cardiac: Normal S1, S2; RRR; no murmurs, rubs, or gallops Lungs: Clear to auscultation bilaterally, no wheezing, rhonchi or rales  Abd: Soft, nontender, no hepatomegaly  Ext: No edema, pulses 2+ Musculoskeletal: No deformities, BUE and BLE strength normal and equal Skin: Warm and dry, no rashes   Neuro: Alert and oriented to person, place, time, and situation, CNII-XII grossly intact, no focal deficits  Psych: Normal mood and  affect   ASSESSMENT:   Anita Salazar is a 21 y.o. female who presents for the following: No diagnosis found.  PLAN:   There are no diagnoses linked to this encounter.  {Are you ordering a CV Procedure (e.g. stress test, cath, DCCV, TEE, etc)?   Press F2        :[id number]}  Disposition: No follow-ups on file.  Medication Adjustments/Labs and Tests Ordered: Current medicines are reviewed at length with the patient today.  Concerns regarding medicines are outlined above.  No orders of the defined types were placed in this encounter.  No orders of the defined types were placed in this encounter.   There are no Patient Instructions on file for this visit.   Time Spent with Patient: I have spent a total of *** minutes with patient reviewing hospital notes, telemetry, EKGs, labs and examining the patient as well as establishing an assessment and plan that was discussed with the patient.  > 50% of time was spent in direct patient care.  Signed, Addison Naegeli. Audie Box, MD, [place]  [address] [place], [place] [address] [phone]  03/22/2022 1:03 PM

## 2022-03-23 ENCOUNTER — Ambulatory Visit: Payer: Medicaid Other | Admitting: Cardiovascular Disease

## 2022-03-23 DIAGNOSIS — I340 Nonrheumatic mitral (valve) insufficiency: Secondary | ICD-10-CM

## 2022-04-05 DIAGNOSIS — Z419 Encounter for procedure for purposes other than remedying health state, unspecified: Secondary | ICD-10-CM | POA: Diagnosis not present

## 2022-04-12 NOTE — Progress Notes (Unsigned)
Cardiology Office Note   Date:  04/13/2022   ID:  QUANEISHA HANISCH, DOB 06-28-2000, MRN [id number]  PCP:  Glendale Chard, DO  Cardiologist:   None Referring:  Glendale Chard, DO  Chief Complaint  Patient presents with   Mitral Regurgitation      History of Present Illness: JOYELL EMAMI is a 21 y.o. female who presents for evaluation of mitral irritation.  She has had mitral valve prolapse is 14 .  I was able to review some notes from her pediatric cardiologist.  Sounds like the last time she saw him at [facility] was 2019.  She had mitral valve prolapse with mild to moderate mitral insufficiency.  Is also been a mention of developmental delay and ADHD.  Her last echocardiogram was reported to demonstrate normal left ventricular function.  She had well-preserved ejection fraction.  She has had an echocardiogram in July with [place].  Results are listed below.  She says she feels well.  She works with disabled folks providing care.  With this she does some physical activity.  To walk up and down hallways with them.  The patient denies any new symptoms such as chest discomfort, neck or arm discomfort. There has been no new shortness of breath, PND or orthopnea. There have been no reported palpitations, presyncope or syncope.    Of note patient wants to get pregnant.  She is currently not using any birth control.     Past Medical History:  Diagnosis Date   Mitral regurgitation    MVP (mitral valve prolapse)     Past Surgical History:  Procedure Laterality Date   None       Current Outpatient Medications  Medication Sig Dispense Refill   ibuprofen (ADVIL,MOTRIN) 200 MG tablet Take 400 mg by mouth every 6 (six) hours as needed for headache, mild pain, moderate pain or cramping.     tizanidine (ZANAFLEX) 2 MG capsule Take 1 capsule (2 mg total) by mouth 3 (three) times daily. 10 capsule 0   No current facility-administered medications for this visit.    Allergies:   Patient has no  known allergies.    Social History:  The patient  reports that she has never smoked. She has never used smokeless tobacco. She reports that she does not drink alcohol and does not use drugs.   Family History:  The patient's family history includes Diabetes in her mother; Heart disease in her father.    ROS:  Please see the history of present illness.   Otherwise, review of systems are positive for none.   All other systems are reviewed and negative.    PHYSICAL EXAM: VS:  BP 112/62 (BP Location: Left Arm, Patient Position: Sitting, Cuff Size: Normal)   Pulse 86   Ht 5\' 7"  (1.702 m)   Wt 104 lb 3.2 oz (47.3 kg)   SpO2 98%   BMI 16.32 kg/m  , BMI Body mass index is 16.32 kg/m. GENERAL:  Well appearing HEENT:  Pupils equal round and reactive, fundi not visualized, oral mucosa unremarkable NECK:  No jugular venous distention, waveform within normal limits, carotid upstroke brisk and symmetric, no bruits, no thyromegaly LYMPHATICS:  No cervical, inguinal adenopathy LUNGS:  Clear to auscultation bilaterally BACK:  No CVA tenderness CHEST:  Unremarkable HEART:  PMI not displaced or sustained,S1 and S2 within normal limits, no S3, no S4, positive clicks, no rubs, holosystolic murmur 4 out of 6 heard over the entire precordium, no diastolic murmurs ABD:  Flat, positive bowel sounds normal in frequency in pitch, no bruits, no rebound, no guarding, no midline pulsatile mass, no hepatomegaly, no splenomegaly EXT:  2 plus pulses throughout, no edema, no cyanosis no clubbing SKIN:  No rashes no nodules NEURO:  Cranial nerves II through XII grossly intact, motor grossly intact throughout PSYCH:  Cognitively intact, oriented to person place and time  ECHO:   1. The mitral valve is abnormal-mitral valve prolapse with at least  moderate, eccentric mitral valve regurgitation. No evidence of mitral  stenosis. Right sided blunting of pulmonary vein flow.   2. Left ventricular ejection fraction, by  estimation, is 60 to 65%. The  left ventricle has normal function. The left ventricle has no regional  wall motion abnormalities. Left ventricular diastolic parameters were  normal. The average left ventricular  global longitudinal strain is -23.7 %. The global longitudinal strain is  normal.   3. Right ventricular systolic function is normal. The right ventricular  size is normal. There is normal pulmonary artery systolic pressure.   4. The aortic valve is tricuspid. Aortic valve regurgitation is not  visualized. No aortic stenosis is present.   Comparison(s): No prior Echocardiogram.    FINDINGS   Left Ventricle: Left ventricular ejection fraction, by estimation, is 60  to 65%. The left ventricle has normal function. The left ventricle has no  regional wall motion abnormalities. The average left ventricular global  longitudinal strain is -23.7 %.  The global longitudinal strain is normal. 3D left ventricular ejection  fraction analysis performed but not reported based on interpreter  judgement due to suboptimal tracking. The left ventricular internal cavity  size was normal in size. There is no left  ventricular hypertrophy. Left ventricular diastolic parameters were  normal.   Right Ventricle: The right ventricular size is normal. No increase in  right ventricular wall thickness. Right ventricular systolic function is  normal. There is normal pulmonary artery systolic pressure. The tricuspid  regurgitant velocity is 2.17 m/s, and   with an assumed right atrial pressure of 3 mmHg, the estimated right  ventricular systolic pressure is 21.8 mmHg.   Left Atrium: Left atrial size was normal in size.   Right Atrium: Right atrial size was normal in size.   Pericardium: There is no evidence of pericardial effusion.   Mitral Valve: The mitral valve is abnormal. Moderate to severe mitral  valve regurgitation. No evidence of mitral valve stenosis.   Tricuspid Valve: The tricuspid  valve is normal in structure. Tricuspid  valve regurgitation is mild . No evidence of tricuspid stenosis.   Aortic Valve: The aortic valve is tricuspid. Aortic valve regurgitation is  not visualized. No aortic stenosis is present.   Pulmonic Valve: The pulmonic valve was normal in structure. Pulmonic valve  regurgitation is not visualized. No evidence of pulmonic stenosis.   Aorta: The aortic root and ascending aorta are structurally normal, with  no evidence of dilitation.   IAS/Shunts: There is left bowing of the interatrial septum, suggestive of  elevated right atrial pressure. No atrial level shunt detected by color  flow Doppler.     LEFT VENTRICLE  PLAX 2D  LVIDd:         5.00 cm   Diastology  LVIDs:         3.10 cm   LV e' medial:    11.10 cm/s  LV PW:         0.80 cm   LV E/e' medial:  11.5  LV IVS:  0.70 cm   LV e' lateral:   15.10 cm/s  LVOT diam:     1.80 cm   LV E/e' lateral: 8.5  LV SV:         36  LV SV Index:   23        2D Longitudinal Strain  LVOT Area:     2.54 cm  2D Strain GLS (A2C):   -22.3 %                           2D Strain GLS (A3C):   -23.4 %                           2D Strain GLS (A4C):   -25.3 %                           2D Strain GLS Avg:     -23.7 %                             3D Volume EF:                           3D EF:        56 %                           LV EDV:       157 ml                           LV ESV:       69 ml                           LV SV:        88 ml   RIGHT VENTRICLE             IVC  RV Basal diam:  3.00 cm     IVC diam: 0.90 cm  RV S prime:     12.30 cm/s  TAPSE (M-mode): 1.7 cm   LEFT ATRIUM             Index        RIGHT ATRIUM          Index  LA diam:        3.50 cm 2.20 cm/m   RA Area:     8.05 cm  LA Vol (A2C):   47.8 ml 30.11 ml/m  RA Volume:   15.90 ml 10.01 ml/m  LA Vol (A4C):   25.5 ml 16.06 ml/m  LA Biplane Vol: 36.8 ml 23.18 ml/m   AORTIC VALVE  LVOT Vmax:   87.55 cm/s  LVOT Vmean:  58.000  cm/s  LVOT VTI:    0.142 m    AORTA  Ao Root diam: 2.70 cm  Ao Asc diam:  2.20 cm   MITRAL VALVE                TRICUSPID VALVE  MV Area (PHT): 3.60 cm     TR Peak grad:   18.8 mmHg  MV Decel Time: 211 msec     TR Vmax:        217.00 cm/s  MR Peak grad: 116.0 mmHg  MR Mean grad: 81.0 mmHg     SHUNTS  MR Vmax:      538.50 cm/s   Systemic VTI:  0.14 m  MR Vmean:     425.5 cm/s    Systemic Diam: 1.80 cm  MV E velocity: 128.00 cm/s  MV A velocity: 89.40 cm/s  MV E/A ratio:  1.43   EKG:  EKG is ordered today. The ekg ordered today demonstrates sinus rhythm, rate 86, RSR prime V1 V2, no acute ST-T wave changes.   Recent Labs: No results found for requested labs within last 365 days.    Lipid Panel No results found for: "CHOL", "TRIG", "HDL", "CHOLHDL", "VLDL", "LDLCALC", "LDLDIRECT"    Wt Readings from Last 3 Encounters:  04/13/22 104 lb 3.2 oz (47.3 kg)  12/15/21 109 lb (49.4 kg)  04/19/17 98 lb 8.7 oz (44.7 kg) (4 %, Z= -1.70)*   * Growth percentiles are based on CDC (Girls, 2-20 Years) data.      Other studies Reviewed: Additional studies/ records that were reviewed today include: Previous pediatric cardiology records. Review of the above records demonstrates:  Please see elsewhere in the note.     ASSESSMENT AND PLAN:  Moderate mitral regurgitation: This is at least moderate.  I would like to get a baseline MRI.  Because she is interested in getting pregnant and I think she would need close follow-up I am going to ask Dr. Servando Salina if she would kindly see the patient.  At this point she is asymptomatic.  I am sure we will be able to follow this clinically.   Current medicines are reviewed at length with the patient today.  The patient does not have concerns regarding medicines.  The following changes have been made:  no change  Labs/ tests ordered today include: MRI  Orders Placed This Encounter  Procedures   EKG 12-Lead     Disposition:   FU with Dr. Servando Salina if  she agrees.     Signed, Rollene Rotunda, MD  04/13/2022 5:25 PM    [facility]

## 2022-04-13 ENCOUNTER — Encounter: Payer: Self-pay | Admitting: Cardiology

## 2022-04-13 ENCOUNTER — Ambulatory Visit: Payer: Medicaid Other | Attending: Internal Medicine | Admitting: Cardiology

## 2022-04-13 VITALS — BP 112/62 | HR 86 | Ht 67.0 in | Wt 104.2 lb

## 2022-04-13 DIAGNOSIS — I340 Nonrheumatic mitral (valve) insufficiency: Secondary | ICD-10-CM | POA: Diagnosis not present

## 2022-04-13 DIAGNOSIS — Z01812 Encounter for preprocedural laboratory examination: Secondary | ICD-10-CM

## 2022-04-13 DIAGNOSIS — I341 Nonrheumatic mitral (valve) prolapse: Secondary | ICD-10-CM | POA: Diagnosis not present

## 2022-04-13 DIAGNOSIS — Z01810 Encounter for preprocedural cardiovascular examination: Secondary | ICD-10-CM

## 2022-04-13 NOTE — Patient Instructions (Addendum)
Medication Instructions:  Continue same   Lab Work: Bmet and cbc have done 1 week before Cardiac MRI.   Testing/Procedures: Cardiac MRI  Follow instructions below  You are scheduled for Cardiac MRI on Mon 07/24/22. Please arrive for your appointment at 7:30 am.?  [facility] [address] [place], [place] [address] [phone] Please take advantage of the free valet parking available at the MAIN entrance (A entrance).  Proceed to the [facility] Radiology Department (First Floor) for check-in.    Magnetic resonance imaging (MRI) is a painless test that produces images of the inside of the body without using Xrays.  During an MRI, strong magnets and radio waves work together in a Data processing manager to form detailed images.   MRI images may provide more details about a medical condition than X-rays, CT scans, and ultrasounds can provide.  You may be given earphones to listen for instructions.  You may eat a light breakfast and take medications as ordered with the exception of HCTZ (fluid pill, other). Please avoid stimulants for 12 hr prior to test. (Ie. Caffeine, nicotine, chocolate, or antihistamine medications)  If a contrast material will be used, an IV will be inserted into one of your veins. Contrast material will be injected into your IV. It will leave your body through your urine within a day. You may be told to drink plenty of fluids to help flush the contrast material out of your system.  You will be asked to remove all metal, including: Watch, jewelry, and other metal objects including hearing aids, hair pieces and dentures. Also wearable glucose monitoring systems (ie. Freestyle Libre and Omnipods) (Braces and fillings normally are not a problem.)   TEST WILL TAKE APPROXIMATELY 1 HOUR  PLEASE NOTIFY SCHEDULING AT LEAST 24 HOURS IN ADVANCE IF YOU ARE UNABLE TO KEEP YOUR APPOINTMENT. [phone]  Please call Rockwell Alexandria, cardiac imaging nurse navigator  with any questions/concerns. Rockwell Alexandria RN Navigator Cardiac Imaging Larey Brick RN Navigator Cardiac Imaging [facility] [phone] Office     Follow-Up: At [facility], you and your health needs are our priority.  As part of our continuing mission to provide you with exceptional heart care, we have created designated Provider Care Teams.  These Care Teams include your primary Cardiologist (physician) and Advanced Practice Providers (APPs -  Physician Assistants and Nurse Practitioners) who all work together to provide you with the care you need, when you need it.  We recommend signing up for the patient portal called "MyChart".  Sign up information is provided on this After Visit Summary.  MyChart is used to connect with patients for Virtual Visits (Telemedicine).  Patients are able to view lab/test results, encounter notes, upcoming appointments, etc.  Non-urgent messages can be sent to your provider as well.   To learn more about what you can do with MyChart, go to [email or web address].    Your next appointment:  2 months    The format for your next appointment: Office   Provider:  Dr.Tobb     Important Information About Sugar

## 2022-04-14 NOTE — Addendum Note (Signed)
Addended by: Neoma Laming on: 04/14/2022 09:01 AM   Modules accepted: Orders

## 2022-05-08 ENCOUNTER — Ambulatory Visit: Payer: Medicaid Other | Attending: Cardiology | Admitting: Cardiology

## 2022-05-08 ENCOUNTER — Encounter: Payer: Self-pay | Admitting: Cardiology

## 2022-05-08 VITALS — BP 120/80 | HR 77 | Ht 67.0 in | Wt 104.2 lb

## 2022-05-08 DIAGNOSIS — I340 Nonrheumatic mitral (valve) insufficiency: Secondary | ICD-10-CM

## 2022-05-08 DIAGNOSIS — Z789 Other specified health status: Secondary | ICD-10-CM

## 2022-05-08 DIAGNOSIS — Z3169 Encounter for other general counseling and advice on procreation: Secondary | ICD-10-CM | POA: Diagnosis not present

## 2022-05-08 NOTE — Patient Instructions (Signed)
Medication Instructions:  Your physician recommends that you continue on your current medications as directed. Please refer to the Current Medication list given to you today.  *If you need a refill on your cardiac medications before your next appointment, please call your pharmacy*   Lab Work: none If you have labs (blood work) drawn today and your tests are completely normal, you will receive your results only by: MyChart Message (if you have MyChart) OR A paper copy in the mail If you have any lab test that is abnormal or we need to change your treatment, we will call you to review the results.   Testing/Procedures: none   Follow-Up: At [facility], you and your health needs are our priority.  As part of our continuing mission to provide you with exceptional heart care, we have created designated Provider Care Teams.  These Care Teams include your primary Cardiologist (physician) and Advanced Practice Providers (APPs -  Physician Assistants and Nurse Practitioners) who all work together to provide you with the care you need, when you need it.  We recommend signing up for the patient portal called "MyChart".  Sign up information is provided on this After Visit Summary.  MyChart is used to connect with patients for Virtual Visits (Telemedicine).  Patients are able to view lab/test results, encounter notes, upcoming appointments, etc.  Non-urgent messages can be sent to your provider as well.   To learn more about what you can do with MyChart, go to [email or web address].    Your next appointment:   Scheduled for Feb. 27th @ 4pm with Dr. Servando Salina.   The format for your next appointment:   In Person  Provider:  Dr. Servando Salina     Other Instructions   Important Information About Sugar

## 2022-05-08 NOTE — Progress Notes (Signed)
Cardio-Obstetrics Clinic  New Evaluation  Date:  05/08/2022   ID:  Anita Salazar, DOB [birth year], MRN [id number]  PCP:  No primary care provider on file.   [facility] Providers Cardiologist:  Thomasene Ripple, DO  Electrophysiologist:  None       Referring MD: Anita Chard, DO   Chief Complaint: "I am ok"  History of Present Illness:    Anita Salazar is a 21 y.o. female [No obstetric history on file.] who is being seen today for the evaluation of mitral regurgitation at the request of Anita Chard, DO.   Medical history includes mitral valve prolapse with mitral regurgitation, chart notes developmental delay and ADHD. She had followed with [facility] pediatric cardiology since she was 14. She tells me that she lost her mother 2019 and had transportation issues there she was unable to make follow up.   She recently was seen by Dr. Bankston Lions to reestablish cardiovascular care. During that visit her Echo was reviewed which showed atleast moderate mitral regurgitation. A Cardiac MRI was ordered. She was referred to me as she is interested in getting pregnant and is actively trying.   She offers no complaints at this time. No chest pain or shortness of breath.   Prior CV Studies Reviewed: The following studies were reviewed today: TTE  ECHO:    1. The mitral valve is abnormal-mitral valve prolapse with at least  moderate, eccentric mitral valve regurgitation. No evidence of mitral  stenosis. Right sided blunting of pulmonary vein flow.   2. Left ventricular ejection fraction, by estimation, is 60 to 65%. The  left ventricle has normal function. The left ventricle has no regional  wall motion abnormalities. Left ventricular diastolic parameters were  normal. The average left ventricular  global longitudinal strain is -23.7 %. The global longitudinal strain is  normal.   3. Right ventricular systolic function is normal. The right ventricular  size is normal. There is  normal pulmonary artery systolic pressure.   4. The aortic valve is tricuspid. Aortic valve regurgitation is not  visualized. No aortic stenosis is present.   Comparison(s): No prior Echocardiogram.    FINDINGS   Left Ventricle: Left ventricular ejection fraction, by estimation, is 60  to 65%. The left ventricle has normal function. The left ventricle has no  regional wall motion abnormalities. The average left ventricular global  longitudinal strain is -23.7 %.  The global longitudinal strain is normal. 3D left ventricular ejection  fraction analysis performed but not reported based on interpreter  judgement due to suboptimal tracking. The left ventricular internal cavity  size was normal in size. There is no left  ventricular hypertrophy. Left ventricular diastolic parameters were  normal.   Right Ventricle: The right ventricular size is normal. No increase in  right ventricular wall thickness. Right ventricular systolic function is  normal. There is normal pulmonary artery systolic pressure. The tricuspid  regurgitant velocity is 2.17 m/s, and   with an assumed right atrial pressure of 3 mmHg, the estimated right  ventricular systolic pressure is 21.8 mmHg.   Left Atrium: Left atrial size was normal in size.   Right Atrium: Right atrial size was normal in size.   Pericardium: There is no evidence of pericardial effusion.   Mitral Valve: The mitral valve is abnormal. Moderate to severe mitral  valve regurgitation. No evidence of mitral valve stenosis.   Tricuspid Valve: The tricuspid valve is normal in structure. Tricuspid  valve regurgitation is mild . No evidence  of tricuspid stenosis.   Aortic Valve: The aortic valve is tricuspid. Aortic valve regurgitation is  not visualized. No aortic stenosis is present.   Pulmonic Valve: The pulmonic valve was normal in structure. Pulmonic valve  regurgitation is not visualized. No evidence of pulmonic stenosis.   Aorta: The  aortic root and ascending aorta are structurally normal, with  no evidence of dilitation.   IAS/Shunts: There is left bowing of the interatrial septum, suggestive of  elevated right atrial pressure. No atrial level shunt detected by color  flow Doppler.   Past Medical History:  Diagnosis Date   Mitral regurgitation    MVP (mitral valve prolapse)     Past Surgical History:  Procedure Laterality Date   None        OB History   No obstetric history on file.         Current Medications: Current Meds  Medication Sig   ibuprofen (ADVIL,MOTRIN) 200 MG tablet Take 400 mg by mouth every 6 (six) hours as needed for headache, mild pain, moderate pain or cramping.     Allergies:   Patient has no known allergies.   Social History   Socioeconomic History   Marital status: Single    Spouse name: Not on file   Number of children: Not on file   Years of education: Not on file   Highest education level: Not on file  Occupational History   Not on file  Tobacco Use   Smoking status: Never   Smokeless tobacco: Never  Substance and Sexual Activity   Alcohol use: No   Drug use: No   Sexual activity: Not on file  Other Topics Concern   Not on file  Social History Narrative   Lives with boyfriend.    Social Determinants of Health   Financial Resource Strain: Not on file  Food Insecurity: Not on file  Transportation Needs: Not on file  Physical Activity: Not on file  Stress: Not on file  Social Connections: Not on file      Family History  Problem Relation Age of Onset   Diabetes Mother    Heart disease Father        No details      ROS:   Please see the history of present illness.     All other systems reviewed and are negative.   Labs/EKG Reviewed:    EKG:   EKG is was not  ordered today.    Recent Labs: No results found for requested labs within last 365 days.   Recent Lipid Panel No results found for: "CHOL", "TRIG", "HDL", "CHOLHDL", "LDLCALC",  "LDLDIRECT"  Physical Exam:    VS:  BP 120/80   Pulse 77   Ht 5\' 7"  (1.702 m)   Wt 104 lb 3.2 oz (47.3 kg)   SpO2 97%   BMI 16.32 kg/m     Wt Readings from Last 3 Encounters:  05/08/22 104 lb 3.2 oz (47.3 kg)  04/13/22 104 lb 3.2 oz (47.3 kg)  12/15/21 109 lb (49.4 kg)     GEN:  Well nourished, well developed in no acute distress HEENT: Normal NECK: No JVD; No carotid bruits LYMPHATICS: No lymphadenopathy CARDIAC: RRR, 4/6 holosystolic murmurs, rubs, gallops RESPIRATORY:  Clear to auscultation without rales, wheezing or rhonchi  ABDOMEN: Soft, non-tender, non-distended MUSCULOSKELETAL:  No edema; No deformity  SKIN: Warm and dry NEUROLOGIC:  Alert and oriented x 3 PSYCHIATRIC:  Normal affect    Risk Assessment/Risk Calculators:  ASSESSMENT & PLAN:    Moderate Mitral regurgitation Cardiovascular preconception Counseling  I reviewed her echo - agree with atleast moderate mitral regurgitation Cardiac MR has been ordered to better quantify her Mitral regurgitation. Thankfully she is asymptomatic. She desires to get pregnant and is actively trying to get pregnant- she is sure that she is not pregnant today. I was able to explain to the patient in great depth about her mitral regurgitation and how it could impact her clinically care during pregnancy.   Though mitral regurgitation can be well tolerated in pregnancy, I think it is better to get a good quantitative understanding this regurgitant lesion with the cardiac MR prior to conception. Further recommendation based on result of her cardiac MR.   She is not on any cardiac medication that needs to be adjust. Clinically she is euvolemic with no symptom of dyspnea on exertion   I will closely follow the patient.   She follows with a fertility specialty group and would like to establish with OBGYN within - will refer her.  She is in agreement with the above plan and all of her question have  been answered - she will follow up 3 days after her MRI.  Patient Instructions  Medication Instructions:  Your physician recommends that you continue on your current medications as directed. Please refer to the Current Medication list given to you today.  *If you need a refill on your cardiac medications before your next appointment, please call your pharmacy*   Lab Work: none If you have labs (blood work) drawn today and your tests are completely normal, you will receive your results only by: MyChart Message (if you have MyChart) OR A paper copy in the mail If you have any lab test that is abnormal or we need to change your treatment, we will call you to review the results.   Testing/Procedures: none   Follow-Up: At [facility], you and your health needs are our priority.  As part of our continuing mission to provide you with exceptional heart care, we have created designated Provider Care Teams.  These Care Teams include your primary Cardiologist (physician) and Advanced Practice Providers (APPs -  Physician Assistants and Nurse Practitioners) who all work together to provide you with the care you need, when you need it.  We recommend signing up for the patient portal called "MyChart".  Sign up information is provided on this After Visit Summary.  MyChart is used to connect with patients for Virtual Visits (Telemedicine).  Patients are able to view lab/test results, encounter notes, upcoming appointments, etc.  Non-urgent messages can be sent to your provider as well.   To learn more about what you can do with MyChart, go to [email or web address].    Your next appointment:   Scheduled for Feb. 27th @ 4pm with Dr. Servando Salina.   The format for your next appointment:   In Person  Provider:  Dr. Servando Salina     Other Instructions   Important Information About Sugar         Dispo:  No follow-ups on file.   Medication Adjustments/Labs and Tests Ordered: Current medicines  are reviewed at length with the patient today.  Concerns regarding medicines are outlined above.  Tests Ordered: Orders Placed This Encounter  Procedures   Ambulatory referral to Obstetrics / Gynecology   Medication Changes: No orders of the defined types were placed in this encounter.

## 2022-07-21 ENCOUNTER — Telehealth (HOSPITAL_COMMUNITY): Payer: Self-pay | Admitting: *Deleted

## 2022-07-21 NOTE — Telephone Encounter (Signed)
Attempted to call patient regarding upcoming cardiac MRI appointment. Left message on voicemail with name and callback number  Sierria Bruney RN Navigator Cardiac Imaging [facility] [phone] Office [phone] Cell  

## 2022-07-24 ENCOUNTER — Ambulatory Visit (HOSPITAL_COMMUNITY): Admission: RE | Admit: 2022-07-24 | Payer: Medicaid Other | Source: Ambulatory Visit

## 2022-08-01 ENCOUNTER — Ambulatory Visit: Payer: Self-pay | Admitting: Cardiology

## 2022-09-13 ENCOUNTER — Telehealth (HOSPITAL_COMMUNITY): Payer: Self-pay | Admitting: Emergency Medicine

## 2022-09-13 NOTE — Telephone Encounter (Signed)
Attempted to call patient regarding upcoming cardiac CT appointment. °Left message on voicemail with name and callback number °Samiah Ricklefs RN Navigator Cardiac Imaging °[facility] °[phone] Office °[phone] Cell ° °

## 2022-09-15 ENCOUNTER — Ambulatory Visit (HOSPITAL_COMMUNITY): Admission: RE | Admit: 2022-09-15 | Payer: Self-pay | Source: Ambulatory Visit

## 2022-09-20 ENCOUNTER — Ambulatory Visit: Payer: Self-pay | Admitting: Cardiology

## 2022-12-06 ENCOUNTER — Telehealth (HOSPITAL_COMMUNITY): Payer: Self-pay | Admitting: *Deleted

## 2022-12-06 NOTE — Telephone Encounter (Signed)
Attempted to call patient regarding upcoming cardiac MRI appointment. Left message on voicemail with name and callback number  Henlee Donovan RN Navigator Cardiac Imaging [facility] [phone] Office [phone] Cell  

## 2022-12-08 ENCOUNTER — Ambulatory Visit (HOSPITAL_COMMUNITY): Admission: RE | Admit: 2022-12-08 | Payer: Self-pay | Source: Ambulatory Visit

## 2022-12-08 ENCOUNTER — Telehealth (HOSPITAL_COMMUNITY): Payer: Self-pay | Admitting: *Deleted

## 2022-12-08 NOTE — Telephone Encounter (Signed)
Returning patient's call requesting to be re-scheduled.  Spoke with patient that this would be the last time we will re-schedule the MRI since she has been rescheduled twice before.  She verbalizes understanding and will make accommodations for her new appointment.  Patient states she has a flat tire today. Will forward to chart to MRI scheduling team.   Larey Brick RN Navigator Cardiac Imaging [facility] [phone] Office [phone] Cell

## 2023-01-19 DIAGNOSIS — Z113 Encounter for screening for infections with a predominantly sexual mode of transmission: Secondary | ICD-10-CM | POA: Diagnosis not present

## 2023-01-19 DIAGNOSIS — I130 Hypertensive heart and chronic kidney disease with heart failure and stage 1 through stage 4 chronic kidney disease, or unspecified chronic kidney disease: Secondary | ICD-10-CM | POA: Diagnosis not present

## 2023-01-19 DIAGNOSIS — N979 Female infertility, unspecified: Secondary | ICD-10-CM | POA: Diagnosis not present

## 2023-01-30 ENCOUNTER — Ambulatory Visit: Payer: Self-pay | Admitting: Cardiology

## 2023-01-31 DIAGNOSIS — N979 Female infertility, unspecified: Secondary | ICD-10-CM | POA: Diagnosis not present

## 2023-02-08 ENCOUNTER — Other Ambulatory Visit: Payer: Self-pay

## 2023-02-08 ENCOUNTER — Encounter (HOSPITAL_COMMUNITY): Payer: Self-pay | Admitting: Emergency Medicine

## 2023-02-08 ENCOUNTER — Emergency Department (HOSPITAL_COMMUNITY)
Admission: EM | Admit: 2023-02-08 | Discharge: 2023-02-08 | Disposition: A | Discharge status: Home / Self Care | Payer: 59 | Attending: Emergency Medicine | Admitting: Emergency Medicine

## 2023-02-08 DIAGNOSIS — R21 Rash and other nonspecific skin eruption: Secondary | ICD-10-CM | POA: Diagnosis not present

## 2023-02-08 MED ORDER — CLOTRIMAZOLE 1 % EX CREA: TOPICAL_CREAM | CUTANEOUS | 0 refills | Status: DC

## 2023-02-08 NOTE — ED Triage Notes (Signed)
Pt presents with rash to left side of neck.  States it has been there a while. No new environmental factors.  Has not spread. Does itch.  Just wants to know what it is

## 2023-02-08 NOTE — Discharge Instructions (Signed)
Please read and follow all provided instructions.  Your diagnoses today include:  1. Rash     Tests performed today include: Vital signs. See below for your results today.   Medications prescribed:  Clotrimazole: antifungal cream  Take any prescribed medications only as directed.  Home care instructions:  Follow any educational materials contained in this packet.  Try the prescribed cream for 1 week.  If this does not help improve the area, then it may not be ringworm.  In this case, you can try over-the-counter hydrocortisone cream or follow-up for prescription strength steroid cream trial.  Follow-up instructions: Please follow-up with your primary care provider in the next 7 days for further evaluation of your symptoms.   Return instructions:  Please return to the Emergency Department if you experience worsening symptoms.  Please return if you have any other emergent concerns.  Additional Information:  Your vital signs today were: BP 127/79 (BP Location: Right Arm)   Pulse 86   Temp 98.5 F (36.9 C) (Oral)   Resp 16   SpO2 100%  If your blood pressure (BP) was elevated above 135/85 this visit, please have this repeated by your doctor within one month. --------------

## 2023-02-08 NOTE — ED Provider Notes (Signed)
[place] EMERGENCY DEPARTMENT AT [facility] Provider Note   CSN: [id number] Arrival date & time: 02/08/23  1609     History  Chief Complaint  Patient presents with   Rash    Anita Salazar is a 22 y.o. female.  Patient with no significant past medical history presents to the emergency department today for evaluation of hyperpigmented rash on the left neck.  It is itchy.  Started about 1 week ago.  No fevers, URI symptoms.  No known skin exposures.  She has not tried any treatments other than applying Aquaphor.  No known sick contacts.       Home Medications Prior to Admission medications   Medication Sig Start Date End Date Taking? Authorizing Provider  clotrimazole (LOTRIMIN) 1 % cream Apply to affected area 2 times daily 02/08/23  Yes Renne Crigler, PA-C  ibuprofen (ADVIL,MOTRIN) 200 MG tablet Take 400 mg by mouth every 6 (six) hours as needed for headache, mild pain, moderate pain or cramping.    [provider]      Allergies    Patient has no known allergies.    Review of Systems   Review of Systems  Physical Exam Updated Vital Signs BP 127/79 (BP Location: Right Arm)   Pulse 86   Temp 98.5 F (36.9 C) (Oral)   Resp 16   SpO2 100%  Physical Exam Vitals and nursing note reviewed.  Constitutional:      Appearance: She is well-developed.  HENT:     Head: Normocephalic and atraumatic.  Eyes:     Conjunctiva/sclera: Conjunctivae normal.  Pulmonary:     Effort: No respiratory distress.  Musculoskeletal:     Cervical back: Normal range of motion and neck supple.  Skin:    General: Skin is warm and dry.     Comments: There is a half dollar size area, hyperpigmented, minimal scaling, slightly more prominent around the border than centrally.  No signs of cellulitis or abscess.  No vesicles.  Neurological:     Mental Status: She is alert.     ED Results / Procedures / Treatments   Labs (all labs ordered are listed, but only abnormal  results are displayed) Labs Reviewed - No data to display  EKG None  Radiology No results found.  Procedures Procedures    Medications Ordered in ED Medications - No data to display  ED Course/ Medical Decision Making/ A&P    Patient seen and examined. History obtained directly from patient.   Labs/EKG: None ordered  Imaging: None ordered  Medications/Fluids: None ordered  Most recent vital signs reviewed and are as follows: BP 127/79 (BP Location: Right Arm)   Pulse 86   Temp 98.5 F (36.9 C) (Oral)   Resp 16   SpO2 100%   Initial impression: Nonspecific rash  Home treatment plan: Will give prescription for antifungal cream to try for 1 week.  I would prefer giving her this prior to giving steroids, as steroids would likely make a tinea infection worse.  Patient encouraged to try this for 1 week and if not improved, transition to topical steroid.  Discussed antihistamine use at home for itching as needed.  Return instructions discussed with patient: New or worsening symptoms   Follow-up instructions discussed with patient: follow-up with PCP in 1 to 2 weeks if not improving.  Medical Decision Making  Patient with nonspecific rash, is not completely classic for tinea corporis, but does have some features.  Will have her treat this as tinea first and then transition to topical steroid if not improved after 1 week.  This is not shingles.  Does not appear to be cellulitic or infectious.        Final Clinical Impression(s) / ED Diagnoses Final diagnoses:  Rash    Rx / DC Orders ED Discharge Orders          Ordered    clotrimazole (LOTRIMIN) 1 % cream        02/08/23 1704              Renne Crigler, PA-C 02/08/23 1710    Rondel Baton, MD 02/10/23 1053

## 2023-02-20 ENCOUNTER — Telehealth (HOSPITAL_COMMUNITY): Payer: Self-pay | Admitting: *Deleted

## 2023-02-20 NOTE — Telephone Encounter (Signed)
Attempted to call patient regarding upcoming cardiac MRI appointment. Left message on voicemail with name and callback number Johney Frame RN Navigator Cardiac Imaging [facility] [phone] Office

## 2023-02-21 ENCOUNTER — Other Ambulatory Visit: Payer: Self-pay | Admitting: Cardiology

## 2023-02-21 ENCOUNTER — Ambulatory Visit (HOSPITAL_COMMUNITY)
Admission: RE | Admit: 2023-02-21 | Discharge: 2023-02-21 | Disposition: A | Discharge status: Home / Self Care | Payer: 59 | Source: Ambulatory Visit | Attending: Cardiology | Admitting: Cardiology

## 2023-02-21 DIAGNOSIS — I340 Nonrheumatic mitral (valve) insufficiency: Secondary | ICD-10-CM | POA: Diagnosis not present

## 2023-02-21 MED ORDER — GADOBUTROL 1 MMOL/ML IV SOLN
8.0000 mL | Freq: Once | INTRAVENOUS | Status: AC | PRN
  Administered 2023-02-21: 8 mL via INTRAVENOUS

## 2023-03-07 ENCOUNTER — Telehealth: Payer: Self-pay | Admitting: Cardiology

## 2023-03-07 ENCOUNTER — Encounter: Payer: Self-pay | Admitting: Cardiology

## 2023-03-07 ENCOUNTER — Ambulatory Visit: Payer: 59 | Attending: Cardiology | Admitting: Cardiology

## 2023-03-07 VITALS — BP 122/74 | HR 71 | Ht 67.0 in | Wt 104.6 lb

## 2023-03-07 DIAGNOSIS — I340 Nonrheumatic mitral (valve) insufficiency: Secondary | ICD-10-CM

## 2023-03-07 DIAGNOSIS — Z3169 Encounter for other general counseling and advice on procreation: Secondary | ICD-10-CM | POA: Diagnosis not present

## 2023-03-07 DIAGNOSIS — Z01812 Encounter for preprocedural laboratory examination: Secondary | ICD-10-CM | POA: Diagnosis not present

## 2023-03-07 NOTE — Patient Instructions (Signed)
Medication Instructions:  Your physician recommends that you continue on your current medications as directed. Please refer to the Current Medication list given to you today.  *If you need a refill on your cardiac medications before your next appointment, please call your pharmacy*   Lab Work: Your physician recommends that you have labs drawn today: BMET, Mag, CBC If you have labs (blood work) drawn today and your tests are completely normal, you will receive your results only by: MyChart Message (if you have MyChart) OR A paper copy in the mail If you have any lab test that is abnormal or we need to change your treatment, we will call you to review the results.   Testing/Procedures: Your physician has requested that you have a TEE. During a TEE, sound waves are used to create images of your heart. It provides your doctor with information about the size and shape of your heart and how well your heart's chambers and valves are working. In this test, a transducer is attached to the end of a flexible tube that's guided down your throat and into your esophagus (the tube leading from you mouth to your stomach) to get a more detailed image of your heart. You are not awake for the procedure. Please see the instruction sheet given to you today. For further information please visit [email or web address]       Dear Anita Salazar are scheduled for a TEE (Transesophageal Echocardiogram) on Tuesday, October 8 with Dr. Cristal Deer.  Please arrive at the [facility] (Main Entrance A) at [facility]: [address] [place], [place] [address] at 9:00 AM (This time is 1 hour(s) before your procedure to ensure your preparation). Free valet parking service is available. You will check in at ADMITTING. The support person will be asked to wait in the waiting room.  It is OK to have someone drop you off and come back when you are ready to be discharged.      DIET:  Nothing to eat or drink after  midnight except a sip of water with medications (see medication instructions below)  MEDICATION INSTRUCTIONS: !!IF ANY NEW MEDICATIONS ARE STARTED AFTER TODAY, PLEASE NOTIFY YOUR PROVIDER AS SOON AS POSSIBLE!!  FYI: Medications such as Semaglutide (Ozempic, [place]), Tirzepatide (Mounjaro, Zepbound), Dulaglutide (Trulicity), etc ("GLP1 agonists") AND Canagliflozin (Invokana), Dapagliflozin (Farxiga), Empagliflozin (Jardiance), Ertugliflozin (Steglatro), Bexagliflozin [employer]) or any combination with one of these drugs such as Invokamet (Canagliflozin/Metformin), Synjardy (Empagliflozin/Metformin), etc ("SGLT2 inhibitors") must be held around the time of a procedure. This is not a comprehensive list of all of these drugs. Please review all of your medications and talk to your provider if you take any one of these. If you are not sure, ask your provider.        LABS: Drawn today   FYI:  For your safety, and to allow [place] to monitor your vital signs accurately during the surgery/procedure we request: If you have artificial nails, gel coating, SNS etc, please have those removed prior to your surgery/procedure. Not having the nail coverings /polish removed may result in cancellation or delay of your surgery/procedure.  You must have a responsible person to drive you home and stay in the waiting area during your procedure. Failure to do so could result in cancellation.  Bring your insurance cards.  *Special Note: Every effort is made to have your procedure done on time. Occasionally there are emergencies that occur at the hospital that may cause delays. Please be patient if  a delay does occur.     Follow-Up: At [facility], you and your health needs are our priority.  As part of our continuing mission to provide you with exceptional heart care, we have created designated Provider Care Teams.  These Care Teams include your primary Cardiologist (physician) and Advanced Practice Providers  (APPs -  Physician Assistants and Nurse Practitioners) who all work together to provide you with the care you need, when you need it.  Your next appointment:   12 week(s)  Provider:   Thomasene Ripple, DO

## 2023-03-07 NOTE — Telephone Encounter (Signed)
Left voicemail to return call to office.

## 2023-03-07 NOTE — Telephone Encounter (Signed)
Patient wants a call back regarding which birth control can she use and will she need to have this before her office visit or before her surgery.

## 2023-03-07 NOTE — Progress Notes (Signed)
Cardiology Office Note:    Date:  03/07/2023   ID:  Anita Salazar, DOB Mar 19, 2001, MRN [id number]  PCP:  Pcp, No  Cardiologist:  Thomasene Ripple, DO  Electrophysiologist:  None   Referring MD: No ref. provider found   " I am doing ok"  History of Present Illness:    Anita Salazar is a 22 y.o. female with a hx of mitral valve prolapse with mitral regurgitation, chart notes developmental delay and ADHD. She had followed with [facility] pediatric cardiology since she was 14. She tells me that she lost her mother 2019 and had transportation issues there she was unable to make follow up mitral regurgitation.  She presents for a follow-up after an MRI and echocardiogram. The MRI confirmed the presence of severe mitral regurgitation and left atrial enlargement.  She denies shortness of breath or chest pain.  Past Medical History:  Diagnosis Date   Mitral regurgitation    MVP (mitral valve prolapse)     Past Surgical History:  Procedure Laterality Date   None      Current Medications: No outpatient medications have been marked as taking for the 03/07/23 encounter (Office Visit) with Thomasene Ripple, DO.     Allergies:   Patient has no known allergies.   Social History   Socioeconomic History   Marital status: Single    Spouse name: Not on file   Number of children: Not on file   Years of education: Not on file   Highest education level: Not on file  Occupational History   Not on file  Tobacco Use   Smoking status: Never   Smokeless tobacco: Never  Substance and Sexual Activity   Alcohol use: No   Drug use: No   Sexual activity: Not on file  Other Topics Concern   Not on file  Social History Narrative   Lives with boyfriend.    Social Determinants of Health   Financial Resource Strain: Not on file  Food Insecurity: Not on file  Transportation Needs: Not on file  Physical Activity: Not on file  Stress: Not on file  Social Connections: Not on file     Family  History: The patient's family history includes Diabetes in her mother; Heart disease in her father.  ROS:   Review of Systems  Constitution: Negative for decreased appetite, fever and weight gain.  HENT: Negative for congestion, ear discharge, hoarse voice and sore throat.   Eyes: Negative for discharge, redness, vision loss in right eye and visual halos.  Cardiovascular: Negative for chest pain, dyspnea on exertion, leg swelling, orthopnea and palpitations.  Respiratory: Negative for cough, hemoptysis, shortness of breath and snoring.   Endocrine: Negative for heat intolerance and polyphagia.  Hematologic/Lymphatic: Negative for bleeding problem. Does not bruise/bleed easily.  Skin: Negative for flushing, nail changes, rash and suspicious lesions.  Musculoskeletal: Negative for arthritis, joint pain, muscle cramps, myalgias, neck pain and stiffness.  Gastrointestinal: Negative for abdominal pain, bowel incontinence, diarrhea and excessive appetite.  Genitourinary: Negative for decreased libido, genital sores and incomplete emptying.  Neurological: Negative for brief paralysis, focal weakness, headaches and loss of balance.  Psychiatric/Behavioral: Negative for altered mental status, depression and suicidal ideas.  Allergic/Immunologic: Negative for HIV exposure and persistent infections.    EKGs/Labs/Other Studies Reviewed:    The following studies were reviewed today:   EKG:  The ekg ordered today demonstrates NSR HR 71 bpm  Recent Labs: No results found for requested labs within last 365 days.  Recent Lipid Panel No results found for: "CHOL", "TRIG", "HDL", "CHOLHDL", "VLDL", "LDLCALC", "LDLDIRECT"  Physical Exam:    VS:  BP 122/74 (BP Location: Left Arm, Patient Position: Sitting, Cuff Size: Normal)   Pulse 71   Ht 5\' 7"  (1.702 m)   Wt 104 lb 9.6 oz (47.4 kg)   SpO2 99%   BMI 16.38 kg/m     Wt Readings from Last 3 Encounters:  03/07/23 104 lb 9.6 oz (47.4 kg)   05/08/22 104 lb 3.2 oz (47.3 kg)  04/13/22 104 lb 3.2 oz (47.3 kg)     GEN: Well nourished, well developed in no acute distress HEENT: Normal NECK: No JVD; No carotid bruits LYMPHATICS: No lymphadenopathy CARDIAC: S1S2 noted,RRR, 4/6 holosystolic murmurs, rubs, gallops RESPIRATORY:  Clear to auscultation without rales, wheezing or rhonchi  ABDOMEN: Soft, non-tender, non-distended, +bowel sounds, no guarding. EXTREMITIES: No edema, No cyanosis, no clubbing MUSCULOSKELETAL:  No deformity  SKIN: Warm and dry NEUROLOGIC:  Alert and oriented x 3, non-focal PSYCHIATRIC:  Normal affect, good insight  ASSESSMENT:    1. Severe  mitral regurgitation   2. Severe mitral regurgitation   3. Pre-procedure lab exam   4. Encounter for preconception consultation    PLAN:    Severe mitral Valve Regurgitation Noted on echocardiogram and confirmed on MRI. No current symptoms of shortness of breath or syncope. Discussed the potential need for surgical intervention.  Order Transesophageal Echocardiogram (TEE) to further evaluate the valve. Refer to CT surgery surgical consultation.  Advise patient to report any new symptoms such as shortness of breath or syncope immediately.  I discuss with the patient about holding off on getting pregnant until we can have a better clinically plan for her mitral regurgitation. I encouraged use of breath control - [facility]  The patient is in agreement with the above plan. The patient left the office in stable condition.  The patient will follow up in 12 weeks.   Medication Adjustments/Labs and Tests Ordered: Current medicines are reviewed at length with the patient today.  Concerns regarding medicines are outlined above.  Orders Placed This Encounter  Procedures   Basic Metabolic Panel (BMET)   Magnesium   CBC with Differential/Platelet   Ambulatory referral to Cardiothoracic Surgery   EKG 12-Lead    No orders of the defined types were placed in this encounter.   Patient Instructions  Medication Instructions:  Your physician recommends that you continue on your current medications as directed. Please refer to the Current Medication list given to you today.  *If you need a refill on your cardiac medications before your next appointment, please call your pharmacy*   Lab Work: Your physician recommends that you have labs drawn today: BMET, Mag, CBC If you have labs (blood work) drawn today and your tests are completely normal, you will receive your results only by: MyChart Message (if you have MyChart) OR A paper copy in the mail If you have any lab test that is abnormal or we need to change your treatment, we will call you to review the results.   Testing/Procedures: Your physician has requested that you have a TEE. During a TEE, sound waves are used to create images of your heart. It provides your doctor with information about the size and shape of your heart and how well your heart's chambers and valves are working. In this test, a transducer is attached to the end of a flexible tube  that's guided down your throat and into your esophagus (the tube leading from you mouth to your stomach) to get a more detailed image of your heart. You are not awake for the procedure. Please see the instruction sheet given to you today. For further information please visit [email or web address]       Dear Erskine Emery are scheduled for a TEE (Transesophageal Echocardiogram) on Tuesday, October 8 with Dr. Cristal Deer.  Please arrive at the [facility] (Main Entrance A) at [facility]: [address] [place], [place] [address] at 9:00 AM (This time is 1 hour(s) before your procedure to ensure your preparation). Free valet parking service is available. You will check in at ADMITTING. The support person will be asked to wait in the waiting room.  It is OK to have someone drop you off and  come back when you are ready to be discharged.      DIET:  Nothing to eat or drink after midnight except a sip of water with medications (see medication instructions below)  MEDICATION INSTRUCTIONS: !!IF ANY NEW MEDICATIONS ARE STARTED AFTER TODAY, PLEASE NOTIFY YOUR PROVIDER AS SOON AS POSSIBLE!!  FYI: Medications such as Semaglutide (Ozempic, [place]), Tirzepatide (Mounjaro, Zepbound), Dulaglutide (Trulicity), etc ("GLP1 agonists") AND Canagliflozin (Invokana), Dapagliflozin (Farxiga), Empagliflozin (Jardiance), Ertugliflozin (Steglatro), Bexagliflozin [employer]) or any combination with one of these drugs such as Invokamet (Canagliflozin/Metformin), Synjardy (Empagliflozin/Metformin), etc ("SGLT2 inhibitors") must be held around the time of a procedure. This is not a comprehensive list of all of these drugs. Please review all of your medications and talk to your provider if you take any one of these. If you are not sure, ask your provider.        LABS: Drawn today   FYI:  For your safety, and to allow [place] to monitor your vital signs accurately during the surgery/procedure we request: If you have artificial nails, gel coating, SNS etc, please have those removed prior to your surgery/procedure. Not having the nail coverings /polish removed may result in cancellation or delay of your surgery/procedure.  You must have a responsible person to drive you home and stay in the waiting area during your procedure. Failure to do so could result in cancellation.  Bring your insurance cards.  *Special Note: Every effort is made to have your procedure done on time. Occasionally there are emergencies that occur at the hospital that may cause delays. Please be patient if a delay does occur.     Follow-Up: At [facility], you and your health needs are our priority.  As part of our continuing mission to provide you with exceptional heart care, we have created designated Provider Care Teams.  These  Care Teams include your primary Cardiologist (physician) and Advanced Practice Providers (APPs -  Physician Assistants and Nurse Practitioners) who all work together to provide you with the care you need, when you need it.  Your next appointment:   12 week(s)  Provider:   Thomasene Ripple, DO         Adopting a Healthy Lifestyle.  Know what a healthy weight is for you (roughly BMI <25) and aim to maintain this   Aim for 7+ servings of fruits and vegetables daily   65-80+ fluid ounces of water or unsweet tea for healthy kidneys   Limit to max 1 drink of alcohol per day; avoid smoking/tobacco   Limit animal fats in diet for cholesterol and heart health - choose grass fed whenever available  Avoid highly processed foods, and foods high in saturated/trans fats   Aim for low stress - take time to unwind and care for your mental health   Aim for 150 min of moderate intensity exercise weekly for heart health, and weights twice weekly for bone health   Aim for 7-9 hours of sleep daily   When it comes to diets, agreement about the perfect plan isnt easy to find, even among the experts. Experts at the [facility] [employer] developed an idea known as the Healthy Eating Plate. Just imagine a plate divided into logical, healthy portions.   The emphasis is on diet quality:   Load up on vegetables and fruits - one-half of your plate: Aim for color and variety, and remember that potatoes dont count.   Go for whole grains - one-quarter of your plate: Whole wheat, barley, wheat berries, quinoa, oats, brown rice, and foods made with them. If you want pasta, go with whole wheat pasta.   Protein power - one-quarter of your plate: Fish, chicken, beans, and nuts are all healthy, versatile protein sources. Limit red meat.   The diet, however, does go beyond the plate, offering a few other suggestions.   Use healthy plant oils, such as olive, canola, soy, corn, sunflower and peanut. Check  the labels, and avoid partially hydrogenated oil, which have unhealthy trans fats.   If youre thirsty, drink water. Coffee and tea are good in moderation, but skip sugary drinks and limit milk and dairy products to one or two daily servings.   The type of carbohydrate in the diet is more important than the amount. Some sources of carbohydrates, such as vegetables, fruits, whole grains, and beans-are healthier than others.   Finally, stay active  Signed, Thomasene Ripple, DO  03/07/2023 1:06 PM    [facility]

## 2023-03-07 NOTE — Telephone Encounter (Signed)
Spoke with patient and she is aware of provider recommendations. She has appoint,ent woth OB/GYN tomorrow to discuss birthcontrol

## 2023-03-07 NOTE — H&P (View-Only) (Signed)
Cardiology Office Note:    Date:  03/07/2023   ID:  Anita Salazar, DOB 01-May-2001, MRN [id number]  PCP:  Pcp, No  Cardiologist:  Thomasene Ripple, DO  Electrophysiologist:  None   Referring MD: No ref. provider found   " I am doing ok"  History of Present Illness:    Anita Salazar is a 22 y.o. female with a hx of mitral valve prolapse with mitral regurgitation, chart notes developmental delay and ADHD. She had followed with [facility] pediatric cardiology since she was 14. She tells me that she lost her mother 2019 and had transportation issues there she was unable to make follow up mitral regurgitation.  She presents for a follow-up after an MRI and echocardiogram. The MRI confirmed the presence of severe mitral regurgitation and left atrial enlargement.  She denies shortness of breath or chest pain.  Past Medical History:  Diagnosis Date   Mitral regurgitation    MVP (mitral valve prolapse)     Past Surgical History:  Procedure Laterality Date   None      Current Medications: No outpatient medications have been marked as taking for the 03/07/23 encounter (Office Visit) with Thomasene Ripple, DO.     Allergies:   Patient has no known allergies.   Social History   Socioeconomic History   Marital status: Single    Spouse name: Not on file   Number of children: Not on file   Years of education: Not on file   Highest education level: Not on file  Occupational History   Not on file  Tobacco Use   Smoking status: Never   Smokeless tobacco: Never  Substance and Sexual Activity   Alcohol use: No   Drug use: No   Sexual activity: Not on file  Other Topics Concern   Not on file  Social History Narrative   Lives with boyfriend.    Social Determinants of Health   Financial Resource Strain: Not on file  Food Insecurity: Not on file  Transportation Needs: Not on file  Physical Activity: Not on file  Stress: Not on file  Social Connections: Not on file     Family  History: The patient's family history includes Diabetes in her mother; Heart disease in her father.  ROS:   Review of Systems  Constitution: Negative for decreased appetite, fever and weight gain.  HENT: Negative for congestion, ear discharge, hoarse voice and sore throat.   Eyes: Negative for discharge, redness, vision loss in right eye and visual halos.  Cardiovascular: Negative for chest pain, dyspnea on exertion, leg swelling, orthopnea and palpitations.  Respiratory: Negative for cough, hemoptysis, shortness of breath and snoring.   Endocrine: Negative for heat intolerance and polyphagia.  Hematologic/Lymphatic: Negative for bleeding problem. Does not bruise/bleed easily.  Skin: Negative for flushing, nail changes, rash and suspicious lesions.  Musculoskeletal: Negative for arthritis, joint pain, muscle cramps, myalgias, neck pain and stiffness.  Gastrointestinal: Negative for abdominal pain, bowel incontinence, diarrhea and excessive appetite.  Genitourinary: Negative for decreased libido, genital sores and incomplete emptying.  Neurological: Negative for brief paralysis, focal weakness, headaches and loss of balance.  Psychiatric/Behavioral: Negative for altered mental status, depression and suicidal ideas.  Allergic/Immunologic: Negative for HIV exposure and persistent infections.    EKGs/Labs/Other Studies Reviewed:    The following studies were reviewed today:   EKG:  The ekg ordered today demonstrates NSR HR 71 bpm  Recent Labs: No results found for requested labs within last 365 days.  Recent Lipid Panel No results found for: "CHOL", "TRIG", "HDL", "CHOLHDL", "VLDL", "LDLCALC", "LDLDIRECT"  Physical Exam:    VS:  BP 122/74 (BP Location: Left Arm, Patient Position: Sitting, Cuff Size: Normal)   Pulse 71   Ht 5\' 7"  (1.702 m)   Wt 104 lb 9.6 oz (47.4 kg)   SpO2 99%   BMI 16.38 kg/m     Wt Readings from Last 3 Encounters:  03/07/23 104 lb 9.6 oz (47.4 kg)   05/08/22 104 lb 3.2 oz (47.3 kg)  04/13/22 104 lb 3.2 oz (47.3 kg)     GEN: Well nourished, well developed in no acute distress HEENT: Normal NECK: No JVD; No carotid bruits LYMPHATICS: No lymphadenopathy CARDIAC: S1S2 noted,RRR, 4/6 holosystolic murmurs, rubs, gallops RESPIRATORY:  Clear to auscultation without rales, wheezing or rhonchi  ABDOMEN: Soft, non-tender, non-distended, +bowel sounds, no guarding. EXTREMITIES: No edema, No cyanosis, no clubbing MUSCULOSKELETAL:  No deformity  SKIN: Warm and dry NEUROLOGIC:  Alert and oriented x 3, non-focal PSYCHIATRIC:  Normal affect, good insight  ASSESSMENT:    1. Severe  mitral regurgitation   2. Severe mitral regurgitation   3. Pre-procedure lab exam   4. Encounter for preconception consultation    PLAN:    Severe mitral Valve Regurgitation Noted on echocardiogram and confirmed on MRI. No current symptoms of shortness of breath or syncope. Discussed the potential need for surgical intervention.  Order Transesophageal Echocardiogram (TEE) to further evaluate the valve. Refer to CT surgery surgical consultation.  Advise patient to report any new symptoms such as shortness of breath or syncope immediately.  I discuss with the patient about holding off on getting pregnant until we can have a better clinically plan for her mitral regurgitation. I encouraged use of breath control - [facility]  The patient is in agreement with the above plan. The patient left the office in stable condition.  The patient will follow up in 12 weeks.   Medication Adjustments/Labs and Tests Ordered: Current medicines are reviewed at length with the patient today.  Concerns regarding medicines are outlined above.  Orders Placed This Encounter  Procedures   Basic Metabolic Panel (BMET)   Magnesium   CBC with Differential/Platelet   Ambulatory referral to Cardiothoracic Surgery   EKG 12-Lead    No orders of the defined types were placed in this encounter.   Patient Instructions  Medication Instructions:  Your physician recommends that you continue on your current medications as directed. Please refer to the Current Medication list given to you today.  *If you need a refill on your cardiac medications before your next appointment, please call your pharmacy*   Lab Work: Your physician recommends that you have labs drawn today: BMET, Mag, CBC If you have labs (blood work) drawn today and your tests are completely normal, you will receive your results only by: MyChart Message (if you have MyChart) OR A paper copy in the mail If you have any lab test that is abnormal or we need to change your treatment, we will call you to review the results.   Testing/Procedures: Your physician has requested that you have a TEE. During a TEE, sound waves are used to create images of your heart. It provides your doctor with information about the size and shape of your heart and how well your heart's chambers and valves are working. In this test, a transducer is attached to the end of a flexible tube  that's guided down your throat and into your esophagus (the tube leading from you mouth to your stomach) to get a more detailed image of your heart. You are not awake for the procedure. Please see the instruction sheet given to you today. For further information please visit [email or web address]       Dear Anita Salazar are scheduled for a TEE (Transesophageal Echocardiogram) on Tuesday, October 8 with Dr. Cristal Deer.  Please arrive at the [facility] (Main Entrance A) at [facility]: [address] [place], [place] [address] at 9:00 AM (This time is 1 hour(s) before your procedure to ensure your preparation). Free valet parking service is available. You will check in at ADMITTING. The support person will be asked to wait in the waiting room.  It is OK to have someone drop you off and  come back when you are ready to be discharged.      DIET:  Nothing to eat or drink after midnight except a sip of water with medications (see medication instructions below)  MEDICATION INSTRUCTIONS: !!IF ANY NEW MEDICATIONS ARE STARTED AFTER TODAY, PLEASE NOTIFY YOUR PROVIDER AS SOON AS POSSIBLE!!  FYI: Medications such as Semaglutide (Ozempic, [place]), Tirzepatide (Mounjaro, Zepbound), Dulaglutide (Trulicity), etc ("GLP1 agonists") AND Canagliflozin (Invokana), Dapagliflozin (Farxiga), Empagliflozin (Jardiance), Ertugliflozin (Steglatro), Bexagliflozin [employer]) or any combination with one of these drugs such as Invokamet (Canagliflozin/Metformin), Synjardy (Empagliflozin/Metformin), etc ("SGLT2 inhibitors") must be held around the time of a procedure. This is not a comprehensive list of all of these drugs. Please review all of your medications and talk to your provider if you take any one of these. If you are not sure, ask your provider.        LABS: Drawn today   FYI:  For your safety, and to allow [place] to monitor your vital signs accurately during the surgery/procedure we request: If you have artificial nails, gel coating, SNS etc, please have those removed prior to your surgery/procedure. Not having the nail coverings /polish removed may result in cancellation or delay of your surgery/procedure.  You must have a responsible person to drive you home and stay in the waiting area during your procedure. Failure to do so could result in cancellation.  Bring your insurance cards.  *Special Note: Every effort is made to have your procedure done on time. Occasionally there are emergencies that occur at the hospital that may cause delays. Please be patient if a delay does occur.     Follow-Up: At [facility], you and your health needs are our priority.  As part of our continuing mission to provide you with exceptional heart care, we have created designated Provider Care Teams.  These  Care Teams include your primary Cardiologist (physician) and Advanced Practice Providers (APPs -  Physician Assistants and Nurse Practitioners) who all work together to provide you with the care you need, when you need it.  Your next appointment:   12 week(s)  Provider:   Thomasene Ripple, DO         Adopting a Healthy Lifestyle.  Know what a healthy weight is for you (roughly BMI <25) and aim to maintain this   Aim for 7+ servings of fruits and vegetables daily   65-80+ fluid ounces of water or unsweet tea for healthy kidneys   Limit to max 1 drink of alcohol per day; avoid smoking/tobacco   Limit animal fats in diet for cholesterol and heart health - choose grass fed whenever available  Avoid highly processed foods, and foods high in saturated/trans fats   Aim for low stress - take time to unwind and care for your mental health   Aim for 150 min of moderate intensity exercise weekly for heart health, and weights twice weekly for bone health   Aim for 7-9 hours of sleep daily   When it comes to diets, agreement about the perfect plan isnt easy to find, even among the experts. Experts at the [facility] [employer] developed an idea known as the Healthy Eating Plate. Just imagine a plate divided into logical, healthy portions.   The emphasis is on diet quality:   Load up on vegetables and fruits - one-half of your plate: Aim for color and variety, and remember that potatoes dont count.   Go for whole grains - one-quarter of your plate: Whole wheat, barley, wheat berries, quinoa, oats, brown rice, and foods made with them. If you want pasta, go with whole wheat pasta.   Protein power - one-quarter of your plate: Fish, chicken, beans, and nuts are all healthy, versatile protein sources. Limit red meat.   The diet, however, does go beyond the plate, offering a few other suggestions.   Use healthy plant oils, such as olive, canola, soy, corn, sunflower and peanut. Check  the labels, and avoid partially hydrogenated oil, which have unhealthy trans fats.   If youre thirsty, drink water. Coffee and tea are good in moderation, but skip sugary drinks and limit milk and dairy products to one or two daily servings.   The type of carbohydrate in the diet is more important than the amount. Some sources of carbohydrates, such as vegetables, fruits, whole grains, and beans-are healthier than others.   Finally, stay active  Signed, Thomasene Ripple, DO  03/07/2023 1:06 PM    [facility]

## 2023-03-07 NOTE — Telephone Encounter (Signed)
Patient was returning phone call 

## 2023-03-07 NOTE — Telephone Encounter (Signed)
Patient calling regarding her conversation with Dr Servando Salina earlier.  She states mention of Birth Control and wants to know when she should start it and if prior to her surgery and what type of BC she should take

## 2023-03-07 NOTE — Telephone Encounter (Signed)
Spoke with patient and she would like a call back to see if you guys can discuss what's going on with her to her Aunt.

## 2023-03-07 NOTE — Telephone Encounter (Signed)
Patient is requesting call back from Dr. Mallory Shirk nurse in regards to follow-up questions she has after he appt today. Please advise.

## 2023-03-07 NOTE — Telephone Encounter (Signed)
Duplicate encounter. Please see previous encounter.  

## 2023-03-07 NOTE — Telephone Encounter (Signed)
Spoke with patient and she states that time works for her as well. She will be waiting for your call

## 2023-03-08 ENCOUNTER — Telehealth: Payer: Self-pay | Admitting: Cardiology

## 2023-03-08 DIAGNOSIS — Z309 Encounter for contraceptive management, unspecified: Secondary | ICD-10-CM | POA: Diagnosis not present

## 2023-03-08 DIAGNOSIS — I340 Nonrheumatic mitral (valve) insufficiency: Secondary | ICD-10-CM | POA: Diagnosis not present

## 2023-03-08 LAB — BASIC METABOLIC PANEL
BUN/Creatinine Ratio: 11 (ref 9–23)
BUN: 9 mg/dL (ref 6–20)
CO2: 24 mmol/L (ref 20–29)
Calcium: 9.1 mg/dL (ref 8.7–10.2)
Chloride: 100 mmol/L (ref 96–106)
Creatinine, Ser: 0.83 mg/dL (ref 0.57–1.00)
Glucose: 85 mg/dL (ref 70–99)
Potassium: 4.6 mmol/L (ref 3.5–5.2)
Sodium: 140 mmol/L (ref 134–144)
eGFR: 102 mL/min/{1.73_m2} (ref >59)

## 2023-03-08 LAB — CBC WITH DIFFERENTIAL/PLATELET
Basophils Absolute: 0.1 10*3/uL (ref 0.0–0.2)
Basos: 2 %
EOS (ABSOLUTE): 0.1 10*3/uL (ref 0.0–0.4)
Eos: 3 %
Hematocrit: 40.3 % (ref 34.0–46.6)
Hemoglobin: 12.8 g/dL (ref 11.1–15.9)
Immature Grans (Abs): 0 10*3/uL (ref 0.0–0.1)
Immature Granulocytes: 0 %
Lymphocytes Absolute: 1.7 10*3/uL (ref 0.7–3.1)
Lymphs: 40 %
MCH: 28 pg (ref 26.6–33.0)
MCHC: 31.8 g/dL (ref 31.5–35.7)
MCV: 88 fL (ref 79–97)
Monocytes Absolute: 0.3 10*3/uL (ref 0.1–0.9)
Monocytes: 8 %
Neutrophils Absolute: 2.1 10*3/uL (ref 1.4–7.0)
Neutrophils: 47 %
Platelets: 259 10*3/uL (ref 150–450)
RBC: 4.57 x10E6/uL (ref 3.77–5.28)
RDW: 13 % (ref 11.7–15.4)
WBC: 4.4 10*3/uL (ref 3.4–10.8)

## 2023-03-08 LAB — MAGNESIUM: Magnesium: 2.2 mg/dL (ref 1.6–2.3)

## 2023-03-08 NOTE — Telephone Encounter (Signed)
Spoke with patient and did advise we do not recommend for her to smoke marijuana or any tobacco products. Will forward to provider

## 2023-03-08 NOTE — Telephone Encounter (Signed)
Pt is requesting a callback regarding her wanting to know if it is safe for her to smoke marijuana. Please advise

## 2023-03-12 ENCOUNTER — Telehealth: Payer: Self-pay | Admitting: Cardiology

## 2023-03-12 NOTE — Telephone Encounter (Signed)
Received return call from pt. Spoke with her. While explaining the TEE procedure, phone line cut off. Attempted to call pt back. Received error message again.

## 2023-03-12 NOTE — Telephone Encounter (Signed)
Patient states she doesn't want her surgery been done at [facility]. Please advise

## 2023-03-12 NOTE — Telephone Encounter (Signed)
Attempted to call pt. Phone rang then received a message "your call can not be completed as dialed." Will send a MyChart message to pt.

## 2023-03-13 ENCOUNTER — Ambulatory Visit (HOSPITAL_COMMUNITY)
Admission: RE | Admit: 2023-03-13 | Discharge: 2023-03-13 | Disposition: A | Discharge status: Home / Self Care | Payer: 59 | Source: Ambulatory Visit | Attending: Cardiology | Admitting: Cardiology

## 2023-03-13 ENCOUNTER — Encounter (HOSPITAL_COMMUNITY)
Admission: RE | Disposition: A | Discharge status: Home / Self Care | Payer: Self-pay | Source: Home / Self Care | Attending: Cardiology

## 2023-03-13 ENCOUNTER — Ambulatory Visit (HOSPITAL_COMMUNITY): Payer: 59 | Admitting: Anesthesiology

## 2023-03-13 ENCOUNTER — Encounter (HOSPITAL_COMMUNITY): Payer: Self-pay | Admitting: Cardiology

## 2023-03-13 ENCOUNTER — Other Ambulatory Visit: Payer: Self-pay

## 2023-03-13 ENCOUNTER — Ambulatory Visit (HOSPITAL_COMMUNITY)
Admission: RE | Admit: 2023-03-13 | Discharge: 2023-03-13 | Disposition: A | Discharge status: Home / Self Care | Payer: 59 | Attending: Cardiology | Admitting: Cardiology

## 2023-03-13 DIAGNOSIS — I340 Nonrheumatic mitral (valve) insufficiency: Secondary | ICD-10-CM | POA: Diagnosis not present

## 2023-03-13 DIAGNOSIS — I081 Rheumatic disorders of both mitral and tricuspid valves: Secondary | ICD-10-CM | POA: Insufficient documentation

## 2023-03-13 DIAGNOSIS — Z8249 Family history of ischemic heart disease and other diseases of the circulatory system: Secondary | ICD-10-CM | POA: Diagnosis not present

## 2023-03-13 HISTORY — PX: TEE WITHOUT CARDIOVERSION: SHX5443

## 2023-03-13 LAB — ECHO TEE
MV M vel: 4.23 m/s
MV Peak grad: 71.6 mm[Hg]
Radius: 0.7 cm

## 2023-03-13 LAB — PREGNANCY, URINE: Preg Test, Ur: NEGATIVE

## 2023-03-13 SURGERY — ECHOCARDIOGRAM, TRANSESOPHAGEAL
Anesthesia: Monitor Anesthesia Care

## 2023-03-13 MED ORDER — SODIUM CHLORIDE 0.9 % IV SOLN
INTRAVENOUS | Status: DC | PRN
Start: 2023-03-13 — End: 2023-03-13

## 2023-03-13 MED ORDER — FENTANYL CITRATE (PF) 100 MCG/2ML IJ SOLN
INTRAMUSCULAR | Status: AC
  Filled 2023-03-13: qty 2

## 2023-03-13 MED ORDER — FENTANYL CITRATE (PF) 100 MCG/2ML IJ SOLN
INTRAMUSCULAR | Status: DC | PRN
Start: 2023-03-13 — End: 2023-03-13
  Administered 2023-03-13: 100 ug via INTRAVENOUS

## 2023-03-13 MED ORDER — PROPOFOL 500 MG/50ML IV EMUL
INTRAVENOUS | Status: DC | PRN
  Administered 2023-03-13: 100 ug/kg/min via INTRAVENOUS

## 2023-03-13 MED ORDER — BUTAMBEN-TETRACAINE-BENZOCAINE 2-2-14 % EX AERO
INHALATION_SPRAY | CUTANEOUS | Status: AC
  Filled 2023-03-13: qty 20

## 2023-03-13 MED ORDER — LIDOCAINE 2% (20 MG/ML) 5 ML SYRINGE
INTRAMUSCULAR | Status: DC | PRN
  Administered 2023-03-13: 60 mg via INTRAVENOUS

## 2023-03-13 MED ORDER — SODIUM CHLORIDE 0.9 % IV SOLN: INTRAVENOUS | Status: DC

## 2023-03-13 MED ORDER — PROPOFOL 10 MG/ML IV BOLUS
INTRAVENOUS | Status: DC | PRN
  Administered 2023-03-13 (×3): 50 mg via INTRAVENOUS
  Administered 2023-03-13: 100 mg via INTRAVENOUS

## 2023-03-13 NOTE — Anesthesia Postprocedure Evaluation (Signed)
Anesthesia Post Note  Patient: Anita Salazar  Procedure(s) Performed: TRANSESOPHAGEAL ECHOCARDIOGRAM     Patient location during evaluation: Cath Lab Anesthesia Type: MAC Level of consciousness: awake and alert Pain management: pain level controlled Vital Signs Assessment: post-procedure vital signs reviewed and stable Respiratory status: spontaneous breathing, nonlabored ventilation, respiratory function stable and patient connected to nasal cannula oxygen Cardiovascular status: stable and blood pressure returned to baseline Postop Assessment: no apparent nausea or vomiting Anesthetic complications: no   No notable events documented.  Last Vitals:  Vitals:   03/13/23 1105 03/13/23 1110  BP: 128/86 (!) 127/92  Pulse: 79 81  Resp: 10 15  Temp:    SpO2: 98% 100%    Last Pain:  Vitals:   03/13/23 1100  TempSrc: Temporal  PainSc:                  Collene Schlichter

## 2023-03-13 NOTE — Interval H&P Note (Signed)
History and Physical Interval Note:  03/13/2023 10:05 AM  Anita Salazar  has presented today for surgery, with the diagnosis of SEVERE MITRAL REGURGITATION.  The various methods of treatment have been discussed with the patient and family. After consideration of risks, benefits and other options for treatment, the patient has consented to  Procedure(s): TRANSESOPHAGEAL ECHOCARDIOGRAM (N/A) as a surgical intervention.  The patient's history has been reviewed, patient examined, no change in status, stable for surgery.  I have reviewed the patient's chart and labs.  Questions were answered to the patient's satisfaction.     Gigi Onstad Cristal Deer

## 2023-03-13 NOTE — Transfer of Care (Signed)
Immediate Anesthesia Transfer of Care Note  Patient: Anita Salazar  Procedure(s) Performed: TRANSESOPHAGEAL ECHOCARDIOGRAM  Patient Location: Cath Lab  Anesthesia Type:MAC  Level of Consciousness: awake and alert   Airway & Oxygen Therapy: Patient Spontanous Breathing and Patient connected to nasal cannula oxygen  Post-op Assessment: Report given to RN and Post -op Vital signs reviewed and stable  Post vital signs: Reviewed and stable  Last Vitals:  Vitals Value Taken Time  BP 106/59 03/13/23 1047  Temp 36.6 C 03/13/23 1047  Pulse 110 03/13/23 1049  Resp 20 03/13/23 1049  SpO2 98 % 03/13/23 1049  Vitals shown include unfiled device data.  Last Pain:  Vitals:   03/13/23 1047  TempSrc: Temporal  PainSc: 0-No pain         Complications: No notable events documented.

## 2023-03-13 NOTE — Discharge Instructions (Signed)
Transesophageal Echocardiogram Transesophageal echocardiogram (TEE) is a test that uses sound waves to take pictures of your heart. TEE is done by passing a small probe attached to a flexible tube down the part of the body that moves food from your mouth to your stomach (esophagus). The pictures give clear images of your heart. This can help your doctor see if there are problems with your heart. Tell a doctor about: Any allergies you have. All medicines you are taking. This includes vitamins, herbs, eye drops, creams, and over-the-counter medicines. Any problems you or family members have had with anesthetic medicines. Any blood disorders you have. Any surgeries you have had. Any medical conditions you have. Any swallowing problems. Whether you have or have had a blockage in the part of the body that moves food from your mouth to your stomach. Whether you are pregnant or may be pregnant. What are the risks? In general, this is a safe procedure. But, problems may occur, such as: Damage to nearby structures or organs. A tear in the part of the body that moves food from your mouth to your stomach. Irregular heartbeat. Hoarse voice or trouble swallowing. Bleeding. What happens before the procedure? Medicines Ask your doctor about changing or stopping: Your normal medicines. Vitamins, herbs, and supplements. Over-the-counter medicines. Do not take aspirin or ibuprofen unless you are told to. General instructions Follow instructions from your doctor about what you cannot eat or drink. You will take out any dentures or dental retainers. Plan to have a responsible adult take you home from the hospital or clinic. Plan to have a responsible adult care for you for the time you are told after you leave the hospital or clinic. This is important. What happens during the procedure?  An IV will be put into one of your veins. You may be given: A sedative. This medicine helps you relax. A medicine  to numb the back of your throat. This may be sprayed or gargled. Your blood pressure, heart rate, and breathing will be watched. You may be asked to lie on your left side. A bite block will be placed in your mouth. This keeps you from biting the tube. The tip of the probe will be placed into the back of your mouth. You will be asked to swallow. Your doctor will take pictures of your heart. The probe and bite block will be taken out after the test is done. The procedure may vary among doctors and hospitals. What can I expect after the procedure? You will be monitored until you leave the hospital or clinic. This includes checking your blood pressure, heart rate, breathing rate, and blood oxygen level. Your throat may feel sore and numb. This will get better over time. You will not be allowed to eat or drink until the numbness has gone away. It is common to have a sore throat for a day or two. It is up to you to get the results of your procedure. Ask how to get your results when they are ready. Follow these instructions at home: If you were given a sedative during your procedure, do not drive or use machines until your doctor says that it is safe. Return to your normal activities when your doctor says that it is safe. Keep all follow-up visits. Summary TEE is a test that uses sound waves to take pictures of your heart. You will be given a medicine to help you relax. Do not drive or use machines until your doctor says that it  is safe. This information is not intended to replace advice given to you by your health care provider. Make sure you discuss any questions you have with your health care provider. Document Revised: 02/02/2021 Document Reviewed: 01/13/2020 Elsevier Patient Education  2024 [employer].

## 2023-03-13 NOTE — CV Procedure (Addendum)
    TRANSESOPHAGEAL ECHOCARDIOGRAM   NAME:  Anita Salazar   MRN: [id number] DOB:  2000/08/19   ADMIT DATE: 03/13/2023  INDICATIONS: Severe mitral regurgitation  PROCEDURE:   Informed consent was obtained prior to the procedure. The risks, benefits and alternatives for the procedure were discussed and the patient comprehended these risks.  Risks include, but are not limited to, cough, sore throat, vomiting, nausea, somnolence, esophageal and stomach trauma or perforation, bleeding, low blood pressure, aspiration, pneumonia, infection, trauma to the teeth and death.    Procedural time out performed. The oropharynx was anesthetized with topical 1% cetacaine.    Patient received monitored anesthesia care under the supervision of Dr. Desmond Lope. Patient received a total of 374 mg propofol, 60 mg 2% lidocaine, 100 mcg fentanyl during the procedure. An OG was placed with suction prior to procedure due to her report of PO intake approximately 8 hours prior.  The transesophageal probe was inserted in the esophagus and stomach without difficulty and multiple views were obtained.    COMPLICATIONS:    There were no immediate complications.  FINDINGS:  LEFT VENTRICLE: EF = 60-65%. No regional wall motion abnormalities.  RIGHT VENTRICLE: Normal size and function.   LEFT ATRIUM: No thrombus/mass.  LEFT ATRIAL APPENDAGE: No thrombus/mass.   RIGHT ATRIUM: No thrombus/mass.  AORTIC VALVE:  Trileaflet. No regurgitation. No vegetation.  MITRAL VALVE:    Abnormal structure. There is mild restriction of the posterior leaflet, and the very tip of the anterior leaflet is also restricted, leading to malcoaptation. Her papillary muscles appear slightly longer than typical, with short chordae length. This may be the etiology of her MR. Her MR jet is very eccentric and nearly parallel to leaflets at the origin.   TRICUSPID VALVE: Appears to have slightly myxomatous appearing structure. Mild to moderate  regurgitation. No vegetation.  PULMONIC VALVE: Grossly normal structure. No regurgitation. No apparent vegetation.  INTERATRIAL SEPTUM: No PFO or ASD seen by color Doppler.  PERICARDIUM: Trivial effusion noted.  DESCENDING AORTA: No plaque seen   CONCLUSION: Abnormal structure of the mitral valve. There is mild restriction of the posterior leaflet, and the very tip of the anterior leaflet is also restricted, leading to malcoaptation. Her papillary muscles appear slightly longer than typical, with short chordae length. This may be the etiology of her MR. Her MR jet is very eccentric and nearly parallel to leaflets at the origin.    Jodelle Red, MD, PhD, [facility]  [facility]  [place]  [facility] at [facility] at [facility] [address] [place], [place] [address] [phone]   10:43 AM

## 2023-03-13 NOTE — Anesthesia Preprocedure Evaluation (Signed)
Anesthesia Evaluation  Patient identified by MRN, date of birth, ID band Patient awake    Reviewed: Allergy & Precautions, NPO status , Patient's Chart, lab work & pertinent test results  Airway Mallampati: II  TM Distance: >3 FB Neck ROM: Full    Dental  (+) Teeth Intact, Dental Advisory Given   Pulmonary neg pulmonary ROS   Pulmonary exam normal breath sounds clear to auscultation       Cardiovascular + Valvular Problems/Murmurs MR and MVP  Rhythm:Regular Rate:Normal + Systolic murmurs    Neuro/Psych negative neurological ROS     GI/Hepatic negative GI ROS, Neg liver ROS,,,  Endo/Other  negative endocrine ROS    Renal/GU negative Renal ROS     Musculoskeletal negative musculoskeletal ROS (+)    Abdominal   Peds  (+) ADHD Hematology negative hematology ROS (+)   Anesthesia Other Findings Day of surgery medications reviewed with the patient.  Reproductive/Obstetrics                              Anesthesia Physical Anesthesia Plan  ASA: 4  Anesthesia Plan: MAC   Post-op Pain Management: Minimal or no pain anticipated   Induction: Intravenous  PONV Risk Score and Plan: 2 and TIVA and Treatment may vary due to age or medical condition  Airway Management Planned: Natural Airway and Simple Face Mask  Additional Equipment:   Intra-op Plan:   Post-operative Plan:   Informed Consent: I have reviewed the patients History and Physical, chart, labs and discussed the procedure including the risks, benefits and alternatives for the proposed anesthesia with the patient or authorized representative who has indicated his/her understanding and acceptance.     Dental advisory given  Plan Discussed with: CRNA and Anesthesiologist  Anesthesia Plan Comments:          Anesthesia Quick Evaluation

## 2023-03-14 ENCOUNTER — Telehealth: Payer: Self-pay | Admitting: Cardiology

## 2023-03-14 ENCOUNTER — Encounter (HOSPITAL_COMMUNITY): Payer: Self-pay | Admitting: Cardiology

## 2023-03-14 NOTE — Telephone Encounter (Signed)
Attempted to call pt and kept getting a recording that the number could not be reached at this time. Attempted to call Ivonne Andrew who is on her designated party release and voicemail was not set up. I finally contacted Jinger Neighbors her aunt and asked her to have pt return our call.

## 2023-03-14 NOTE — Telephone Encounter (Signed)
Pt did return call and appt scheduled for 10/18 at !0:20. Pt has concerns about her upcoming appt. She wants to know if this is to discuss having surgery. Pt reports she is nervous and anxious about her appt and would like more information. Attempted to reassure pt appt was to discuss a plan but she wanted to know if she is going to have surgery.

## 2023-03-14 NOTE — Telephone Encounter (Signed)
Patient is returning call and requesting call back to discuss appt and to get other information. Please advise.   Requesting call to # [phone]

## 2023-03-14 NOTE — Telephone Encounter (Signed)
Patient calling in about her TEE, calling to see if she have to have surgery. Please advise

## 2023-03-16 NOTE — Telephone Encounter (Signed)
Spoke with pt. See chart.  

## 2023-03-16 NOTE — Telephone Encounter (Signed)
Called pt back, she is worried about the appt Friday. Pt wants to know what will be discussed. Pt is very anxious. She states okay I'll see ya'll Friday than."

## 2023-03-16 NOTE — Telephone Encounter (Signed)
Patient is calling to follow up

## 2023-03-19 ENCOUNTER — Telehealth: Payer: Self-pay | Admitting: Cardiology

## 2023-03-19 NOTE — Telephone Encounter (Signed)
Pt is calling about instructions she received after her TEE. She stated it says to limit alcohol to 1 glass/cup per day. She would like to know how long do she have to limit her alcohol use and how much can she have daily?

## 2023-03-19 NOTE — Telephone Encounter (Signed)
Patient is calling to verify if she is safe to drink alcohol. Please advise.

## 2023-03-20 NOTE — Telephone Encounter (Signed)
Message relayed to pt per Dr Servando Salina.   Please let her know we will discuss all of this at her upcoming visit on friday

## 2023-03-21 ENCOUNTER — Telehealth: Payer: Self-pay | Admitting: Cardiology

## 2023-03-21 DIAGNOSIS — Z3009 Encounter for other general counseling and advice on contraception: Secondary | ICD-10-CM | POA: Diagnosis not present

## 2023-03-21 DIAGNOSIS — Z3049 Encounter for surveillance of other contraceptives: Secondary | ICD-10-CM | POA: Diagnosis not present

## 2023-03-21 DIAGNOSIS — Z01419 Encounter for gynecological examination (general) (routine) without abnormal findings: Secondary | ICD-10-CM | POA: Diagnosis not present

## 2023-03-21 DIAGNOSIS — Z3202 Encounter for pregnancy test, result negative: Secondary | ICD-10-CM | POA: Diagnosis not present

## 2023-03-21 NOTE — Telephone Encounter (Signed)
Patient would like to know if she can eat [employer] white cheddar popcorn and drink white milk (maybe 2%).

## 2023-03-21 NOTE — Telephone Encounter (Signed)
Called pt, went over different foods she should limit and foods she should increase. Pt states "I'm just scared. I don;t know what I can and can't eat." Reassured pt she can ear but she should be mindful of overly processed foods and foods high in sodium. She verbalized understanding.

## 2023-03-23 ENCOUNTER — Encounter: Payer: Self-pay | Admitting: Cardiology

## 2023-03-23 ENCOUNTER — Telehealth: Payer: Self-pay | Admitting: Cardiology

## 2023-03-23 ENCOUNTER — Ambulatory Visit: Payer: 59 | Attending: Cardiology | Admitting: Cardiology

## 2023-03-23 VITALS — BP 120/74 | HR 90 | Ht 66.0 in | Wt 103.4 lb

## 2023-03-23 DIAGNOSIS — Z3009 Encounter for other general counseling and advice on contraception: Secondary | ICD-10-CM

## 2023-03-23 DIAGNOSIS — I340 Nonrheumatic mitral (valve) insufficiency: Secondary | ICD-10-CM | POA: Diagnosis not present

## 2023-03-23 NOTE — Patient Instructions (Signed)
Medication Instructions:  NO CHANGES  *If you need a refill on your cardiac medications before your next appointment, please call your pharmacy*  Follow-Up: At [facility], you and your health needs are our priority.  As part of our continuing mission to provide you with exceptional heart care, we have created designated Provider Care Teams.  These Care Teams include your primary Cardiologist (physician) and Advanced Practice Providers (APPs -  Physician Assistants and Nurse Practitioners) who all work together to provide you with the care you need, when you need it.  We recommend signing up for the patient portal called "MyChart".  Sign up information is provided on this After Visit Summary.  MyChart is used to connect with patients for Virtual Visits (Telemedicine).  Patients are able to view lab/test results, encounter notes, upcoming appointments, etc.  Non-urgent messages can be sent to your provider as well.   To learn more about what you can do with MyChart, go to [email or web address].    Your next appointment:    12 weeks with Dr. Alger Simons have been referred to [facility] team to discuss your heart valve

## 2023-03-23 NOTE — Telephone Encounter (Signed)
Patient called to let Dr. Servando Salina know that she would like for [facility] to do her surgery instead of [facility]. Please call back to discuss

## 2023-03-25 NOTE — Progress Notes (Signed)
Cardiology Office Note:    Date:  03/25/2023   ID:  Anita Salazar, DOB Jul 17, 2000, MRN [id number]  PCP:  Pcp, No  Cardiologist:  Thomasene Ripple, DO  Electrophysiologist:  None   Referring MD: No ref. provider found   " I am doing ok"  History of Present Illness:    Anita Salazar is a 22 y.o. female with a hx of mitral valve prolapse with severe mitral regurgitation.    chart review notes developmental delay and ADHD. She had followed with [facility] pediatric cardiology since she was 14. She tells me that she lost her mother 2019 and had transportation issues there she was unable to make follow up mitral regurgitation.  She was seen initially due to her desire to conceive. At that visit she had an echo which showed significant MR and the her cardiac MR was pending. She did get the cardiac MR again with significant MR - I send her for TEE. She is here with her aunt  Anita Salazar to discuss the TEE results.   She denies shortness of breath or chest pain.   Past Medical History:  Diagnosis Date   Mitral regurgitation    MVP (mitral valve prolapse)     Past Surgical History:  Procedure Laterality Date   None     TEE WITHOUT CARDIOVERSION N/A 03/13/2023   Procedure: TRANSESOPHAGEAL ECHOCARDIOGRAM;  Surgeon: Jodelle Red, MD;  Location: [facility] INVASIVE CV LAB;  Service: Cardiovascular;  Laterality: N/A;    Current Medications: No outpatient medications have been marked as taking for the 03/23/23 encounter (Office Visit) with Thomasene Ripple, DO.     Allergies:   Patient has no known allergies.   Social History   Socioeconomic History   Marital status: Single    Spouse name: Not on file   Number of children: Not on file   Years of education: Not on file   Highest education level: Not on file  Occupational History   Not on file  Tobacco Use   Smoking status: Never   Smokeless tobacco: Never  Substance and Sexual Activity   Alcohol use: No   Drug use: No   Sexual  activity: Not on file  Other Topics Concern   Not on file  Social History Narrative   Lives with boyfriend.    Social Determinants of Health   Financial Resource Strain: Not on file  Food Insecurity: Not on file  Transportation Needs: Not on file  Physical Activity: Not on file  Stress: Not on file  Social Connections: Not on file     Family History: The patient's family history includes Diabetes in her mother; Heart disease in her father.  ROS:   Review of Systems  Constitution: Negative for decreased appetite, fever and weight gain.  HENT: Negative for congestion, ear discharge, hoarse voice and sore throat.   Eyes: Negative for discharge, redness, vision loss in right eye and visual halos.  Cardiovascular: Negative for chest pain, dyspnea on exertion, leg swelling, orthopnea and palpitations.  Respiratory: Negative for cough, hemoptysis, shortness of breath and snoring.   Endocrine: Negative for heat intolerance and polyphagia.  Hematologic/Lymphatic: Negative for bleeding problem. Does not bruise/bleed easily.  Skin: Negative for flushing, nail changes, rash and suspicious lesions.  Musculoskeletal: Negative for arthritis, joint pain, muscle cramps, myalgias, neck pain and stiffness.  Gastrointestinal: Negative for abdominal pain, bowel incontinence, diarrhea and excessive appetite.  Genitourinary: Negative for decreased libido, genital sores and incomplete emptying.  Neurological: Negative for  brief paralysis, focal weakness, headaches and loss of balance.  Psychiatric/Behavioral: Negative for altered mental status, depression and suicidal ideas.  Allergic/Immunologic: Negative for HIV exposure and persistent infections.    EKGs/Labs/Other Studies Reviewed:    The following studies were reviewed today:   EKG:  The ekg ordered today demonstrates NSR HR 71 bpm  Recent Labs: 03/07/2023: BUN 9; Creatinine, Ser 0.83; Hemoglobin 12.8; Magnesium 2.2; Platelets 259; Potassium  4.6; Sodium 140  Recent Lipid Panel No results found for: "CHOL", "TRIG", "HDL", "CHOLHDL", "VLDL", "LDLCALC", "LDLDIRECT"  Physical Exam:    VS:  BP 120/74 (BP Location: Left Arm, Patient Position: Sitting, Cuff Size: Normal)   Pulse 90   Ht 5\' 6"  (1.676 m)   Wt 103 lb 6.4 oz (46.9 kg)   LMP 03/13/2023 (Exact Date)   SpO2 96%   BMI 16.69 kg/m     Wt Readings from Last 3 Encounters:  03/23/23 103 lb 6.4 oz (46.9 kg)  03/13/23 105 lb (47.6 kg)  03/07/23 104 lb 9.6 oz (47.4 kg)     GEN: Well nourished, well developed in no acute distress HEENT: Normal NECK: No JVD; No carotid bruits LYMPHATICS: No lymphadenopathy CARDIAC: S1S2 noted,RRR, 4/6 holosystolic murmurs, rubs, gallops RESPIRATORY:  Clear to auscultation without rales, wheezing or rhonchi  ABDOMEN: Soft, non-tender, non-distended, +bowel sounds, no guarding. EXTREMITIES: No edema, No cyanosis, no clubbing MUSCULOSKELETAL:  No deformity  SKIN: Warm and dry NEUROLOGIC:  Alert and oriented x 3, non-focal PSYCHIATRIC:  Normal affect, good insight  ASSESSMENT:    1. Severe mitral regurgitation   2. Advised to use contraception    PLAN:    Mitral Valve Prolapse with Regurgitation Significant valve leakage observed on TEE, Discussed the need for surgical intervention and the potential risks and benefits of different valve types (mechanical vs bioprosthetic). -Refer to cardiac surgeons for further evaluation and discussion of surgical options. -Plan to follow up in 12 weeks after surgical consultation.  Contraception Discussed the importance of avoiding pregnancy at this time due to the high-risk nature of a potential pregnancy with her current cardiac condition. -Start birth control implant today. -Advise to use condoms in addition to the implant for added protection.  Advise patient to report any new symptoms such as shortness of breath or syncope immediately.  I discuss with the patient about holding off on  getting pregnant until we can have a better clinically plan for her mitral regurgitation. I encouraged use of breath control - she [facility]  The patient is in agreement with the above plan. The patient left the office in stable condition.  The patient will follow up in 12 weeks.   Medication Adjustments/Labs and Tests Ordered: Current medicines are reviewed at length with the patient today.  Concerns regarding medicines are outlined above.  Orders Placed This Encounter  Procedures   Ambulatory referral to Cardiothoracic Surgery   No orders of the defined types were placed in this encounter.   Patient Instructions  Medication Instructions:  NO CHANGES  *If you need a refill on your cardiac medications before your next appointment, please call your pharmacy*  Follow-Up: At [facility], you and your health needs are our priority.  As part of our continuing mission to provide you with exceptional heart care, we have created designated Provider Care Teams.  These Care Teams include your primary Cardiologist (physician) and Advanced Practice Providers (APPs -  Physician Assistants and Nurse Practitioners) who  all work together to provide you with the care you need, when you need it.  We recommend signing up for the patient portal called "MyChart".  Sign up information is provided on this After Visit Summary.  MyChart is used to connect with patients for Virtual Visits (Telemedicine).  Patients are able to view lab/test results, encounter notes, upcoming appointments, etc.  Non-urgent messages can be sent to your provider as well.   To learn more about what you can do with MyChart, go to [email or web address].    Your next appointment:    12 weeks with Dr. Alger Simons have been referred to [facility] team to discuss your heart valve    Adopting a Healthy Lifestyle.  Know what a healthy weight is for you (roughly  BMI <25) and aim to maintain this   Aim for 7+ servings of fruits and vegetables daily   65-80+ fluid ounces of water or unsweet tea for healthy kidneys   Limit to max 1 drink of alcohol per day; avoid smoking/tobacco   Limit animal fats in diet for cholesterol and heart health - choose grass fed whenever available   Avoid highly processed foods, and foods high in saturated/trans fats   Aim for low stress - take time to unwind and care for your mental health   Aim for 150 min of moderate intensity exercise weekly for heart health, and weights twice weekly for bone health   Aim for 7-9 hours of sleep daily   When it comes to diets, agreement about the perfect plan isnt easy to find, even among the experts. Experts at the [facility] [employer] developed an idea known as the Healthy Eating Plate. Just imagine a plate divided into logical, healthy portions.   The emphasis is on diet quality:   Load up on vegetables and fruits - one-half of your plate: Aim for color and variety, and remember that potatoes dont count.   Go for whole grains - one-quarter of your plate: Whole wheat, barley, wheat berries, quinoa, oats, brown rice, and foods made with them. If you want pasta, go with whole wheat pasta.   Protein power - one-quarter of your plate: Fish, chicken, beans, and nuts are all healthy, versatile protein sources. Limit red meat.   The diet, however, does go beyond the plate, offering a few other suggestions.   Use healthy plant oils, such as olive, canola, soy, corn, sunflower and peanut. Check the labels, and avoid partially hydrogenated oil, which have unhealthy trans fats.   If youre thirsty, drink water. Coffee and tea are good in moderation, but skip sugary drinks and limit milk and dairy products to one or two daily servings.   The type of carbohydrate in the diet is more important than the amount. Some sources of carbohydrates, such as vegetables, fruits, whole  grains, and beans-are healthier than others.   Finally, stay active  Signed, Thomasene Ripple, DO  03/25/2023 12:58 AM    [facility]

## 2023-03-26 ENCOUNTER — Other Ambulatory Visit: Payer: Self-pay

## 2023-03-26 ENCOUNTER — Telehealth: Payer: Self-pay | Admitting: Cardiology

## 2023-03-26 DIAGNOSIS — I340 Nonrheumatic mitral (valve) insufficiency: Secondary | ICD-10-CM

## 2023-03-26 NOTE — Telephone Encounter (Signed)
Spoke with patient and advised her referral was sent to [facility] as requested. Also gave her phone number to clinic.

## 2023-03-26 NOTE — Telephone Encounter (Signed)
Patient is calling to speak with a nurse in regards to the referral that was sent to [facility]. Please advise.

## 2023-03-26 NOTE — Telephone Encounter (Addendum)
Referral for [facility] made per Dr. Mallory Shirk request.

## 2023-03-27 ENCOUNTER — Telehealth: Payer: Self-pay | Admitting: Cardiology

## 2023-03-27 NOTE — Telephone Encounter (Signed)
Spoke to patient and [employer] sheet left up front for pick up.

## 2023-03-27 NOTE — Telephone Encounter (Signed)
Patient states during her recent appointment, Dr. Servando Salina advised her not to eat any salt and she would like to know what she meant by that.

## 2023-03-28 ENCOUNTER — Telehealth: Payer: Self-pay | Admitting: Cardiology

## 2023-03-28 DIAGNOSIS — Z3202 Encounter for pregnancy test, result negative: Secondary | ICD-10-CM | POA: Diagnosis not present

## 2023-03-28 DIAGNOSIS — Z30017 Encounter for initial prescription of implantable subdermal contraceptive: Secondary | ICD-10-CM | POA: Diagnosis not present

## 2023-03-28 NOTE — Telephone Encounter (Signed)
Patient stated she wants a call back to discuss her referral to [facility].  Patient stated she also wants to know if she can have Ensure.

## 2023-03-29 ENCOUNTER — Encounter: Payer: Self-pay | Admitting: Cardiology

## 2023-03-29 NOTE — Telephone Encounter (Signed)
Error

## 2023-03-29 NOTE — Telephone Encounter (Signed)
Informed aunt that referral for [facility] has been sent three different times and currently shows pending status. Informed her she will have to follow up with [facility] moving forward for futher updates about appt scheduling.  Recommended pt keep appt on Monday with Eugenio Hoes, MD if concerns regarding surgery still there. She can address concerns at that appt. Aunt verbalized understanding. No further questions at this time.

## 2023-03-29 NOTE — Telephone Encounter (Signed)
Pt called back in stating she spoke with [facility] and they do not have her in the system. She asked that the referral be sent over again and she asked if Dionne Milo can call her back.

## 2023-03-29 NOTE — Telephone Encounter (Signed)
Aunt ask that she be contacted for update. Please advise

## 2023-03-29 NOTE — Telephone Encounter (Signed)
Patient is following up. She states the nurse called her aunt and she just feels like that wasn't fair because she's the patient. She states that she just wants to know if she can drink Ensure shakes to keep her weight up.

## 2023-04-01 NOTE — Progress Notes (Unsigned)
[address]       [place] [address]             [phone]           Anita Salazar [facility] Medical Record #[id number] Date of Birth: 09-14-2000  Anita Ripple, DO Pcp, No  Chief Complaint:   Mitral Regurgitation  History of Present Illness:     Pt is a 22 yo female who at the age of 39 was evaluated for skipped heart beats. She had a difficult time with the loss of both of her parents and not had follow up. Recently she was seen and worked up for a murmur and found to have normal LV function with moderate to severe MR (likely severe by MRI) and was felt to need surgical consultation. She is asymptomatic without DOE or fatigue. She has nondescript cp at times but not exertional. She denies rheumatic fever. Her etiology of the MR appears to be shortened cords and restriction of posterior leaflet causing over ride of the anterior leaflet      Past Medical History:  Diagnosis Date   Mitral regurgitation    MVP (mitral valve prolapse)     Past Surgical History:  Procedure Laterality Date   None     TEE WITHOUT CARDIOVERSION N/A 03/13/2023   Procedure: TRANSESOPHAGEAL ECHOCARDIOGRAM;  Surgeon: Jodelle Red, MD;  Location: [facility] INVASIVE CV LAB;  Service: Cardiovascular;  Laterality: N/A;    Social History   Tobacco Use  Smoking Status Never  Smokeless Tobacco Never    Social History   Substance and Sexual Activity  Alcohol Use No    Social History   Socioeconomic History   Marital status: Single    Spouse name: Not on file   Number of children: Not on file   Years of education: Not on file   Highest education level: Not on file  Occupational History   Not on file  Tobacco Use   Smoking status: Never   Smokeless tobacco: Never  Substance and Sexual Activity   Alcohol use: No   Drug use: No   Sexual activity: Not on file  Other Topics Concern   Not on file  Social History Narrative   Lives with boyfriend.    Social  Determinants of Health   Financial Resource Strain: Not on file  Food Insecurity: Not on file  Transportation Needs: Not on file  Physical Activity: Not on file  Stress: Not on file  Social Connections: Not on file  Intimate Partner Violence: Not on file    No Known Allergies  Current Outpatient Medications  Medication Sig Dispense Refill   ibuprofen (ADVIL) 200 MG tablet Take 200 mg by mouth every 4 (four) hours as needed for headache. (Patient not taking: Reported on 03/23/2023)     No current facility-administered medications for this visit.     Family History  Problem Relation Age of Onset   Diabetes Mother    Heart disease Father        No details       Physical Exam: LMP 03/13/2023 (Exact Date)  Teeth in good repair Lungs clear Card: RR with 3/6 sem Ext: No edema Neuro: alert and no focal deficits    Diagnostic Studies & Laboratory data: I have personally reviewed the following studies and agree with the findings   MRI (02/2023) FINDINGS: 1. Mild to moderate dilation of the left ventricle, with LVEDD 63 mm and LVEDV 160 mL  but LVEDVi 106 mL/m2 (Normal 41-81 mL/sq-m).   Normal left ventricular thickness.   Normal left ventricular systolic function (LVEF =55%). There are no regional wall motion abnormalities.   Left ventricular parametric mapping notable for normal T1 and T2. No hematocrit for ECV analysis.   There is no late gadolinium enhancement in the left ventricular myocardium.   2. Normal right ventricular size with RVEDVI 69 mL/m2.   Normal right ventricular thickness.   Normal right ventricular systolic function (RVEF =55%). There are no regional wall motion abnormalities or aneurysms.   3. Significant left atrial dilation, with maximal left atrial indexed volume 68.89 ml/m2. Normal right atrial size.   4. Normal size of the aortic root, ascending aorta and pulmonary artery.   5. Valve assessment:   Aortic Valve: Likely tri-leaflet  valve. No significant aortic regurgitation.   Pulmonic Valve: No significant pulmonic insufficiency.   Tricuspid Valve: Mild to moderate central tricuspid regurgitation. Regurgitation fraction 26%.   Mitral Valve: Mechanism of mitral regurgitation is not fully demonstrated in this study. There is a component of posterior restriction but with normal ventricular function. There is one eccentric jet that is just medial to A-2 P-2. The regurgitant volume is on average 38 mL (moderate). The regurgitant fraction is 45% (severe). Given atrial dilation and left ventricular dilation, suspect severe mitral regurgitation. No evidence of mitral stenosis.   6.  Normal pericardium.  No pericardial effusion.   7. Grossly, no extracardiac findings. Recommended dedicated study if concerned for non-cardiac pathology.   IMPRESSION: Moderate to severe eccentric mitral regurgitation with associated left atrial dilation and left ventricular dilation.   TEE(03/2023) IMPRESSIONS     1. Left ventricular ejection fraction, by estimation, is 60 to 65%. The  left ventricle has normal function. The left ventricular internal cavity  size was mildly dilated.   2. Right ventricular systolic function is normal. The right ventricular  size is normal.   3. Left atrial size was severely dilated. No left atrial/left atrial  appendage thrombus was detected.   4. There is mild restriction of the MV posterior leaflet, and the very  tip of the anterior leaflet is also restricted, leading to malcoaptation  and override of the anterior leaflet. Her papillary muscles appear  slightly longer than typical, with short  chordae length. This may be the etiology of her MR. Her MR jet is very  eccentric and nearly parallel to leaflets at the origin. The mitral valve  is abnormal. Severe mitral valve regurgitation. No evidence of mitral  stenosis.   5. 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   6. The aortic valve is tricuspid. Aortic valve regurgitation is not  visualized. No aortic stenosis is present.   Conclusion(s)/Recommendation(s): Severe eccentric mitral valve  regurgitation, with abnormalities as described.   FINDINGS   Left Ventricle: Left ventricular ejection fraction, by estimation, is 60  to 65%. The left ventricle has normal function. The left ventricular  internal cavity size was mildly dilated.   Right Ventricle: The right ventricular size is normal. No increase in  right ventricular wall thickness. Right ventricular systolic function is  normal.   Left Atrium: Left atrial size was severely dilated. No left atrial/left  atrial appendage thrombus was detected.   Right Atrium: Right atrial size was normal in size.   Pericardium: Trivial pericardial effusion is present.   Mitral Valve: There is mild restriction of the MV posterior leaflet, and  the very tip  of the anterior leaflet is also restricted, leading to  malcoaptation and override of the anterior leaflet. Her papillary muscles  appear slightly longer than typical,  with short chordae length. This may be the etiology of her MR. Her MR jet  is very eccentric and nearly parallel to leaflets at the origin. The  mitral valve is abnormal. Severe mitral valve regurgitation. No evidence  of mitral valve stenosis. There is no  evidence of mitral valve vegetation.   Tricuspid Valve: The tricuspid valve is myxomatous. Tricuspid valve  regurgitation is mild to moderate. No evidence of tricuspid stenosis.  There is no evidence of tricuspid valve vegetation.   Aortic Valve: The aortic valve is tricuspid. Aortic valve regurgitation is  not visualized. No aortic stenosis is present. There is no evidence of  aortic valve vegetation.   Pulmonic Valve: The pulmonic valve was normal in structure. Pulmonic valve  regurgitation is not visualized. No evidence of pulmonic stenosis. There  is no  evidence of pulmonic valve vegetation.   Aorta: The aortic root and ascending aorta are structurally normal, with  no evidence of dilitation.   IAS/Shunts: No atrial level shunt detected by color flow Doppler.   Additional Comments: Spectral Doppler performed.   MR Peak grad:    71.6 mmHg  MR Mean grad:    45.0 mmHg  MR Vmax:         423.00 cm/s  MR Vmean:        314.0 cm/s  MR PISA:         3.08 cm  MR PISA Eff ROA: 28 mm  MR PISA Radius:  0.70 cm    Recent Radiology Findings:       Recent Lab Findings: Lab Results  Component Value Date   WBC 4.4 03/07/2023   HGB 12.8 03/07/2023   HCT 40.3 03/07/2023   PLT 259 03/07/2023   GLUCOSE 85 03/07/2023   NA 140 03/07/2023   K 4.6 03/07/2023   CL 100 03/07/2023   CREATININE 0.83 03/07/2023   BUN 9 03/07/2023   CO2 24 03/07/2023      Assessment / Plan:     22 yo female with NYHA class 0 symptoms of moderate to severe MR with normal LV function and no evidence of pulm htn. 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 With her being asymptomatic, this should be followed closely by cardiology and if evidence of symptoms, phtn or ventricular dysfunction occurs to then consider surgical intervention since the chance of mechanical replacement need is significant. Pt and family understand and wish to be followed for now   I have spent 60 min in review of the records, viewing studies and in face to face with patient and in coordination of future care    Eugenio Hoes 04/01/2023 10:38 AM

## 2023-04-02 ENCOUNTER — Encounter: Payer: Self-pay | Admitting: Thoracic Surgery (Cardiothoracic Vascular Surgery)

## 2023-04-02 ENCOUNTER — Institutional Professional Consult (permissible substitution) (INDEPENDENT_AMBULATORY_CARE_PROVIDER_SITE_OTHER): Payer: 59 | Admitting: Thoracic Surgery (Cardiothoracic Vascular Surgery)

## 2023-04-02 VITALS — BP 130/81 | HR 88 | Resp 20 | Ht 67.0 in | Wt 105.0 lb

## 2023-04-02 DIAGNOSIS — I340 Nonrheumatic mitral (valve) insufficiency: Secondary | ICD-10-CM | POA: Diagnosis not present

## 2023-04-02 NOTE — Patient Instructions (Signed)
Follow medically

## 2023-04-04 ENCOUNTER — Ambulatory Visit: Payer: Self-pay | Admitting: Cardiology

## 2023-04-12 ENCOUNTER — Ambulatory Visit (INDEPENDENT_AMBULATORY_CARE_PROVIDER_SITE_OTHER): Payer: 59 | Admitting: Student

## 2023-04-12 ENCOUNTER — Encounter: Payer: Self-pay | Admitting: Student

## 2023-04-12 VITALS — BP 113/65 | HR 85 | Ht 66.0 in | Wt 105.1 lb

## 2023-04-12 DIAGNOSIS — I340 Nonrheumatic mitral (valve) insufficiency: Secondary | ICD-10-CM

## 2023-04-12 DIAGNOSIS — Z1159 Encounter for screening for other viral diseases: Secondary | ICD-10-CM | POA: Diagnosis not present

## 2023-04-12 DIAGNOSIS — Z114 Encounter for screening for human immunodeficiency virus [HIV]: Secondary | ICD-10-CM

## 2023-04-12 NOTE — Progress Notes (Addendum)
Annual Wellness Visit     Patient: Anita Salazar, Female    DOB: 14-Dec-2000, 22 y.o.   MRN: [id number]  Subjective  Chief Complaint  Patient presents with   Annual Exam    Anita Salazar is a 22 y.o. female who presents today for her Annual Wellness Visit.  Diet:Regular food but reducing salt intake Sleep: Get about 8hrs, sleep through the night  Exercise: Stay active at work, involves a lot of walking Alcohol use:  No Tobacco use: previously vaping but stopped  Illicit drug use: Marijuana but hasn't used in a month due to heart condition  Sexually active: Yes and inconsistently Works as: Education officer, environmental discharge rooms at [employer] with: Boy friend and cousin   Medical concerns: None but will like referral to another Cardiologist.  Patient has history of mitral valve regurgitation and has been following closely with cardiology.  Patient expressed desire t to switch cardiologist.  She has denied any chest pain, shortness of breath or lower extremity edema.  Medications: Outpatient Medications Prior to Visit  Medication Sig   ibuprofen (ADVIL) 200 MG tablet Take 200 mg by mouth every 4 (four) hours as needed for headache. (Patient not taking: Reported on 03/23/2023)   No facility-administered medications prior to visit.    Objective  BP 113/65   Pulse 85   Ht 5\' 6"  (1.676 m)   Wt 105 lb 2 oz (47.7 kg)   LMP 03/13/2023 (Exact Date)   SpO2 100%   BMI 16.97 kg/m   Physical Exam Constitutional:      Appearance: Normal appearance. She is normal weight.  HENT:     Right Ear: Tympanic membrane normal.     Left Ear: Tympanic membrane normal.     Mouth/Throat:     Mouth: Mucous membranes are moist.  Eyes:     Extraocular Movements: Extraocular movements intact.     Conjunctiva/sclera: Conjunctivae normal.     Pupils: Pupils are equal, round, and reactive to light.  Cardiovascular:     Rate and Rhythm: Normal rate and regular rhythm.     Heart sounds: Murmur  (Systolic murmur) heard.  Pulmonary:     Effort: Pulmonary effort is normal.     Breath sounds: Normal breath sounds.  Abdominal:     General: Abdomen is flat. Bowel sounds are normal.     Palpations: Abdomen is soft.  Musculoskeletal:        General: Normal range of motion.     Cervical back: Normal range of motion and neck supple.  Skin:    General: Skin is warm and dry.     Capillary Refill: Capillary refill takes less than 2 seconds.  Neurological:     General: No focal deficit present.     Mental Status: She is alert and oriented to person, place, and time. Mental status is at baseline.  Psychiatric:        Mood and Affect: Mood normal.        Behavior: Behavior normal.     Assessment & Plan   22 year old female with history of mitral valve regurgitation presenting for annual wellness visits today.  No medical concerns.  Exam unremarkable today.  Patient did request referral to another cardiologist as she would like to change  care from her current cardiologist. Referral placed.  Patient had a question as to whether marijuana use could affect her heart condition (mitral valve regurgitation).  Informed patient while I want to encourage marijuana use I am  unaware of any effect of marijuana use on her heart condition however if she is having hyperemesis due to marijuana use it could lead to dehydration which will have any impact her heart condition.  Annual wellness visit done today including the all of the following: Reviewed patient's Family Medical History Reviewed and updated list of patient's medical providers Assessment of cognitive impairment was done Assessed patient's functional ability Established a written schedule for health screening services Health Risk Assessent Completed and Reviewed  Exercise Activities and Dietary recommendations  Goals   None      There is no immunization history on file for this patient.  Health Maintenance  Topic Date Due   HPV  VACCINES (1 - 3-dose series) Never done   HIV Screening  Never done   Hepatitis C Screening  Never done   DTaP/Tdap/Td (1 - Tdap) Never done   Cervical Cancer Screening (Pap smear)  Never done   INFLUENZA VACCINE  Never done   COVID-19 Vaccine (1 - 2023-24 season) Never done     Discussed health benefits of physical activity, and encouraged her to engage in regular exercise appropriate for her age and condition.    Problem List Items Addressed This Visit       Cardiovascular and Mediastinum   Mitral valve insufficiency   Relevant Orders   Ambulatory referral to Cardiology   Other Visit Diagnoses     Encounter for screening for human immunodeficiency virus (HIV)    -  Primary   Relevant Orders   HIV antibody (with reflex)   Encounter for hepatitis C screening test for low risk patient       Relevant Orders   Hepatitis C antibody (reflex, frozen specimen)       No follow-ups on file.     Jerre Simon, MD

## 2023-04-12 NOTE — Patient Instructions (Signed)
It was wonderful to meet you today. Thank you for allowing me to be a part of your care. Below is a short summary of what we discussed at your visit today:  Today we are obtaining labs to screen for HIV and hepatitis C.  Make sure to continue watching your diet and eating low salt diet and low-carb diet.  I have sent in referral for a new cardiologist.  You should look out for a call to schedule an appointment once we have found one for you.  If you have any questions or concerns, please do not hesitate to contact us via phone or MyChart message.   Jerre Simon, MD [facility]

## 2023-04-16 DIAGNOSIS — I959 Hypotension, unspecified: Secondary | ICD-10-CM | POA: Diagnosis not present

## 2023-04-16 DIAGNOSIS — I509 Heart failure, unspecified: Secondary | ICD-10-CM | POA: Diagnosis not present

## 2023-04-16 DIAGNOSIS — I340 Nonrheumatic mitral (valve) insufficiency: Secondary | ICD-10-CM | POA: Diagnosis not present

## 2023-04-16 LAB — HCV INTERPRETATION

## 2023-04-16 LAB — HIV 1/2 AB DIFFERENTIATION
HIV 1 Ab: NONREACTIVE
HIV 2 Ab: NONREACTIVE
NOTE (HIV CONF MULTIP: NEGATIVE

## 2023-04-16 LAB — HIV-1/HIV-2 QUALITATIVE RNA
Final Interpretation: NEGATIVE
HIV-1 RNA, Qualitative: NONREACTIVE
HIV-2 RNA, Qualitative: NONREACTIVE

## 2023-04-16 LAB — HIV ANTIBODY (ROUTINE TESTING W REFLEX): HIV Screen 4th Generation wRfx: REACTIVE

## 2023-04-16 LAB — HCV AB W REFLEX TO QUANT PCR: HCV Ab: NONREACTIVE

## 2023-05-14 ENCOUNTER — Ambulatory Visit: Payer: 59 | Admitting: Student

## 2023-05-14 ENCOUNTER — Telehealth: Payer: Self-pay | Admitting: Cardiology

## 2023-05-14 NOTE — Telephone Encounter (Signed)
05/14/23 Per Patient, do not release any information to Jinger Neighbors (AUNT) pt to come in on 12/10 and sign new DPR

## 2023-05-20 DIAGNOSIS — J9811 Atelectasis: Secondary | ICD-10-CM | POA: Diagnosis not present

## 2023-05-20 DIAGNOSIS — I341 Nonrheumatic mitral (valve) prolapse: Secondary | ICD-10-CM | POA: Diagnosis not present

## 2023-05-20 DIAGNOSIS — J939 Pneumothorax, unspecified: Secondary | ICD-10-CM | POA: Diagnosis not present

## 2023-05-20 DIAGNOSIS — Z7982 Long term (current) use of aspirin: Secondary | ICD-10-CM | POA: Diagnosis not present

## 2023-05-20 DIAGNOSIS — J986 Disorders of diaphragm: Secondary | ICD-10-CM | POA: Diagnosis not present

## 2023-05-20 DIAGNOSIS — G479 Sleep disorder, unspecified: Secondary | ICD-10-CM | POA: Diagnosis not present

## 2023-05-20 DIAGNOSIS — Z7901 Long term (current) use of anticoagulants: Secondary | ICD-10-CM | POA: Diagnosis not present

## 2023-05-20 DIAGNOSIS — D6489 Other specified anemias: Secondary | ICD-10-CM | POA: Diagnosis not present

## 2023-05-20 DIAGNOSIS — R918 Other nonspecific abnormal finding of lung field: Secondary | ICD-10-CM | POA: Diagnosis not present

## 2023-05-20 DIAGNOSIS — I340 Nonrheumatic mitral (valve) insufficiency: Secondary | ICD-10-CM | POA: Diagnosis not present

## 2023-05-20 DIAGNOSIS — Z4682 Encounter for fitting and adjustment of non-vascular catheter: Secondary | ICD-10-CM | POA: Diagnosis not present

## 2023-05-20 DIAGNOSIS — Z452 Encounter for adjustment and management of vascular access device: Secondary | ICD-10-CM | POA: Diagnosis not present

## 2023-05-20 DIAGNOSIS — Z952 Presence of prosthetic heart valve: Secondary | ICD-10-CM | POA: Diagnosis not present

## 2023-05-20 DIAGNOSIS — Z87891 Personal history of nicotine dependence: Secondary | ICD-10-CM | POA: Diagnosis not present

## 2023-05-20 DIAGNOSIS — J90 Pleural effusion, not elsewhere classified: Secondary | ICD-10-CM | POA: Diagnosis not present

## 2023-05-21 DIAGNOSIS — Z952 Presence of prosthetic heart valve: Secondary | ICD-10-CM | POA: Diagnosis not present

## 2023-05-21 DIAGNOSIS — J9811 Atelectasis: Secondary | ICD-10-CM | POA: Diagnosis not present

## 2023-05-21 DIAGNOSIS — Z452 Encounter for adjustment and management of vascular access device: Secondary | ICD-10-CM | POA: Diagnosis not present

## 2023-05-21 DIAGNOSIS — Z4682 Encounter for fitting and adjustment of non-vascular catheter: Secondary | ICD-10-CM | POA: Diagnosis not present

## 2023-05-21 DIAGNOSIS — J90 Pleural effusion, not elsewhere classified: Secondary | ICD-10-CM | POA: Diagnosis not present

## 2023-05-21 DIAGNOSIS — J939 Pneumothorax, unspecified: Secondary | ICD-10-CM | POA: Diagnosis not present

## 2023-05-21 DIAGNOSIS — I340 Nonrheumatic mitral (valve) insufficiency: Secondary | ICD-10-CM | POA: Diagnosis not present

## 2023-05-21 DIAGNOSIS — J986 Disorders of diaphragm: Secondary | ICD-10-CM | POA: Diagnosis not present

## 2023-05-22 ENCOUNTER — Ambulatory Visit: Payer: 59 | Admitting: Cardiology

## 2023-05-22 DIAGNOSIS — J90 Pleural effusion, not elsewhere classified: Secondary | ICD-10-CM | POA: Diagnosis not present

## 2023-05-22 DIAGNOSIS — Z4682 Encounter for fitting and adjustment of non-vascular catheter: Secondary | ICD-10-CM | POA: Diagnosis not present

## 2023-05-22 DIAGNOSIS — Z452 Encounter for adjustment and management of vascular access device: Secondary | ICD-10-CM | POA: Diagnosis not present

## 2023-05-22 DIAGNOSIS — J986 Disorders of diaphragm: Secondary | ICD-10-CM | POA: Diagnosis not present

## 2023-05-22 DIAGNOSIS — Z952 Presence of prosthetic heart valve: Secondary | ICD-10-CM | POA: Diagnosis not present

## 2023-05-22 DIAGNOSIS — J9811 Atelectasis: Secondary | ICD-10-CM | POA: Diagnosis not present

## 2023-05-22 DIAGNOSIS — I340 Nonrheumatic mitral (valve) insufficiency: Secondary | ICD-10-CM | POA: Diagnosis not present

## 2023-05-22 DIAGNOSIS — J939 Pneumothorax, unspecified: Secondary | ICD-10-CM | POA: Diagnosis not present

## 2023-05-23 DIAGNOSIS — Z952 Presence of prosthetic heart valve: Secondary | ICD-10-CM | POA: Diagnosis not present

## 2023-05-23 DIAGNOSIS — J90 Pleural effusion, not elsewhere classified: Secondary | ICD-10-CM | POA: Diagnosis not present

## 2023-05-23 DIAGNOSIS — R918 Other nonspecific abnormal finding of lung field: Secondary | ICD-10-CM | POA: Diagnosis not present

## 2023-05-23 DIAGNOSIS — I340 Nonrheumatic mitral (valve) insufficiency: Secondary | ICD-10-CM | POA: Diagnosis not present

## 2023-05-23 DIAGNOSIS — Z452 Encounter for adjustment and management of vascular access device: Secondary | ICD-10-CM | POA: Diagnosis not present

## 2023-05-23 DIAGNOSIS — Z4682 Encounter for fitting and adjustment of non-vascular catheter: Secondary | ICD-10-CM | POA: Diagnosis not present

## 2023-05-24 DIAGNOSIS — Z4682 Encounter for fitting and adjustment of non-vascular catheter: Secondary | ICD-10-CM | POA: Diagnosis not present

## 2023-05-24 DIAGNOSIS — I340 Nonrheumatic mitral (valve) insufficiency: Secondary | ICD-10-CM | POA: Diagnosis not present

## 2023-05-24 DIAGNOSIS — R918 Other nonspecific abnormal finding of lung field: Secondary | ICD-10-CM | POA: Diagnosis not present

## 2023-05-24 DIAGNOSIS — Z452 Encounter for adjustment and management of vascular access device: Secondary | ICD-10-CM | POA: Diagnosis not present

## 2023-05-25 DIAGNOSIS — Z952 Presence of prosthetic heart valve: Secondary | ICD-10-CM | POA: Diagnosis not present

## 2023-05-25 DIAGNOSIS — Z452 Encounter for adjustment and management of vascular access device: Secondary | ICD-10-CM | POA: Diagnosis not present

## 2023-05-25 DIAGNOSIS — Z7901 Long term (current) use of anticoagulants: Secondary | ICD-10-CM | POA: Insufficient documentation

## 2023-05-25 DIAGNOSIS — Z4682 Encounter for fitting and adjustment of non-vascular catheter: Secondary | ICD-10-CM | POA: Diagnosis not present

## 2023-05-26 DIAGNOSIS — Z952 Presence of prosthetic heart valve: Secondary | ICD-10-CM | POA: Diagnosis not present

## 2023-05-26 DIAGNOSIS — Z452 Encounter for adjustment and management of vascular access device: Secondary | ICD-10-CM | POA: Diagnosis not present

## 2023-05-26 DIAGNOSIS — R918 Other nonspecific abnormal finding of lung field: Secondary | ICD-10-CM | POA: Diagnosis not present

## 2023-05-26 DIAGNOSIS — Z4682 Encounter for fitting and adjustment of non-vascular catheter: Secondary | ICD-10-CM | POA: Diagnosis not present

## 2023-05-27 DIAGNOSIS — Z452 Encounter for adjustment and management of vascular access device: Secondary | ICD-10-CM | POA: Diagnosis not present

## 2023-05-27 DIAGNOSIS — R918 Other nonspecific abnormal finding of lung field: Secondary | ICD-10-CM | POA: Diagnosis not present

## 2023-05-27 DIAGNOSIS — Z4682 Encounter for fitting and adjustment of non-vascular catheter: Secondary | ICD-10-CM | POA: Diagnosis not present

## 2023-05-27 DIAGNOSIS — Z952 Presence of prosthetic heart valve: Secondary | ICD-10-CM | POA: Diagnosis not present

## 2023-05-28 ENCOUNTER — Encounter: Payer: Self-pay | Admitting: Student

## 2023-05-28 ENCOUNTER — Ambulatory Visit (INDEPENDENT_AMBULATORY_CARE_PROVIDER_SITE_OTHER): Payer: 59 | Admitting: Student

## 2023-05-28 VITALS — BP 100/75 | HR 110 | Ht 66.0 in | Wt 102.8 lb

## 2023-05-28 DIAGNOSIS — I340 Nonrheumatic mitral (valve) insufficiency: Secondary | ICD-10-CM | POA: Diagnosis not present

## 2023-05-28 NOTE — Patient Instructions (Signed)
Good to see you today  Glad to hear you are doing well  You are currently on Wafarin because you have a mechanical. You will need to come back in a month to check your blood and bleeding lab.   Be cautious with alcohol consumption as that could inter with the medication

## 2023-05-28 NOTE — Progress Notes (Signed)
    SUBJECTIVE:   CHIEF COMPLAINT / HPI:   22 year old female with history of Mitra regurg with preserved LV function TEE showed Mitral Regurg due to abnormal papillary muscle and shorten Chordae Had a mechanical valve placement  due to leaflet being too loose  Started on Wafarin 2.5mg  of daily after procedure Today patient endorses feeling well without any concerns  Denies any chest pain, shortness of breath or LE swelling   PERTINENT  PMH / PSH: Reviewed   OBJECTIVE:   BP 100/75   Pulse (!) 110   Ht 5\' 6"  (1.676 m)   Wt 102 lb 12.8 oz (46.6 kg)   LMP 04/13/2023   SpO2 97%   BMI 16.59 kg/m    Physical Exam General: Alert, well appearing, NAD, Cardiovascular: RRR, mild systolic murmur  Respiratory: CTAB, No wheezing or Rales Abdomen: No distension or tenderness Extremities: No edema on extremities    ASSESSMENT/PLAN:   Severe mitral regurgitation Status post Mechanical Valve placement. No reported complication from her procedure. Today patient is doing well without any concerns. Already started on life long warfarin. INR of 2.9  from 2 days ago is within the targeted goal of 2.5-3.5. Most recent labs yesterday were generally WNL. Following up with Cardiology on 06/24/23. Will follow up in 1 month to recheck INR.     Jerre Simon, MD [facility]

## 2023-05-28 NOTE — Assessment & Plan Note (Signed)
Status post Mechanical Valve placement. No reported complication from her procedure. Today patient is doing well without any concerns. Already started on life long warfarin. INR of 2.9  from 2 days ago is within the targeted goal of 2.5-3.5. Most recent labs yesterday were generally WNL. Following up with Cardiology on 06/24/23. Will follow up in 1 month to recheck INR.

## 2023-05-28 NOTE — Assessment & Plan Note (Signed)
>>  ASSESSMENT AND PLAN FOR SEVERE MITRAL REGURGITATION WRITTEN ON 05/28/2023 11:11 PM BY Jerre Simon, MD  Status post Mechanical Valve placement. No reported complication from her procedure. Today patient is doing well without any concerns. Already started on life long warfarin. INR of 2.9  from 2 days ago is within the targeted goal of 2.5-3.5. Most recent labs yesterday were generally WNL. Following up with Cardiology on 06/24/23. Will follow up in 1 month to recheck INR.

## 2023-05-31 ENCOUNTER — Other Ambulatory Visit: Payer: 59

## 2023-05-31 ENCOUNTER — Encounter: Payer: Self-pay | Admitting: Family Medicine

## 2023-05-31 ENCOUNTER — Telehealth: Payer: Self-pay

## 2023-05-31 DIAGNOSIS — Z7901 Long term (current) use of anticoagulants: Secondary | ICD-10-CM

## 2023-05-31 DIAGNOSIS — Z952 Presence of prosthetic heart valve: Secondary | ICD-10-CM

## 2023-05-31 LAB — POCT INR: INR: 1.6 — AB (ref 2.0–3.0)

## 2023-05-31 NOTE — Telephone Encounter (Signed)
Toni Amend NP with [facility] calls nurse line again in regards to INR.   She asks we follow her weekly for one month and reassess.  Patient has first INR check with [place] this afternoon.  Patient has a Cardiology apt mid January.  Will forward to Provider to advise on INR checks.

## 2023-05-31 NOTE — Telephone Encounter (Signed)
Received call from Chancy Milroy, NP at [facility] regarding INR check.   She reports that patient was supposed to get INR checked at visit on 05/28/23.This was not obtained at this visit. NP voiced concerns as patient has had a few nosebleeds and wants to make sure that INR is within therapeutic level.    Spoke with Dr. Jennette Kettle who advised that patient could receive lab INR check at clinic this afternoon.   Scheduled patient for lab visit at 1530. Spoke with Molly Maduro about this appointment.   Will also forward to Dr. Jennette Kettle to sign orders.   Veronda Prude, RN

## 2023-05-31 NOTE — Telephone Encounter (Signed)
Spoke with NP [place] from Dr. Thea Silversmith (CT surgery) office.  They will send dc summary. She needs INR 2,5-3 for her newly placed mechanical valve. Her INR was 1.6 today, taking 12.5 mg daily. We will do 15 mg today (adding extra dose to make total of 15) and 15 tomorrow, return to 12.5 Sat and Sunday and f/u on Monday here for INR.

## 2023-05-31 NOTE — Progress Notes (Signed)
Patient is subtherapeutic today at 1.6. Goal is 2.5-3.5. Current coumadin regimen is 12.5 mg daily. Patient instructed to take 15 mg today and tomorrow, then 12.5 mg Sat and Sun and come back on Monday for a repeat INR. Doris Cheadle Ehab Humber

## 2023-06-04 ENCOUNTER — Other Ambulatory Visit: Payer: 59

## 2023-06-04 DIAGNOSIS — Z7901 Long term (current) use of anticoagulants: Secondary | ICD-10-CM

## 2023-06-04 LAB — POCT INR: INR: 2.1 (ref 2.0–3.0)

## 2023-06-07 ENCOUNTER — Telehealth (HOSPITAL_COMMUNITY): Payer: Self-pay

## 2023-06-07 ENCOUNTER — Encounter (HOSPITAL_COMMUNITY): Payer: Self-pay

## 2023-06-07 NOTE — Telephone Encounter (Signed)
 Attempted to call patient in regards to Cardiac Rehab - LM on VM

## 2023-06-07 NOTE — Telephone Encounter (Signed)
Outside/paper referral received by Dr. Zebedee Iba from [place]. Will fax over Physician order and request further documents. Insurance benefits and eligibility to be determined.

## 2023-06-07 NOTE — Telephone Encounter (Signed)
 Sent pt a message in regards to cardiac rehab referral we received from [facility].

## 2023-06-08 ENCOUNTER — Other Ambulatory Visit: Payer: 59

## 2023-06-08 DIAGNOSIS — Z952 Presence of prosthetic heart valve: Secondary | ICD-10-CM | POA: Diagnosis not present

## 2023-06-09 LAB — PROTIME-INR
INR: 2 — ABNORMAL HIGH (ref 0.9–1.2)
Prothrombin Time: 21.4 s — ABNORMAL HIGH (ref 9.1–12.0)

## 2023-06-11 ENCOUNTER — Ambulatory Visit: Payer: 59 | Admitting: Student

## 2023-06-11 ENCOUNTER — Telehealth: Payer: Self-pay

## 2023-06-11 NOTE — Telephone Encounter (Signed)
 Spoke to patient concerning recent INR from Friday. Patient complains of having nose bleeds while on coumadin . Her INR on Friday was subtherapeutic at 2.0 with a goal of 2.5 - 3.5. Patient requested appointment today to address her nose bleeds. Scheduled for this afternoon. Anita Salazar Corby Villasenor

## 2023-06-11 NOTE — Progress Notes (Deleted)
  SUBJECTIVE:   CHIEF COMPLAINT / HPI:   Nosebleeds:  PERTINENT  PMH / PSH: MV replacement on Coumadin   OBJECTIVE:  LMP 04/13/2023  Physical Exam   ASSESSMENT/PLAN:   Assessment & Plan  No follow-ups on file. Kieth Johnson, DO 06/11/2023, 1:25 PM PGY-***, [facility] {    This will disappear when note is signed, click to select method of visit    :1}

## 2023-06-11 NOTE — Telephone Encounter (Signed)
 Received call from patient stating that she is returning a call to [place].   Transferred to [facility] directly.   Veronda Prude, RN

## 2023-06-12 ENCOUNTER — Ambulatory Visit (INDEPENDENT_AMBULATORY_CARE_PROVIDER_SITE_OTHER): Payer: 59 | Admitting: Family Medicine

## 2023-06-12 VITALS — BP 121/82 | HR 107 | Ht 66.0 in | Wt 104.8 lb

## 2023-06-12 DIAGNOSIS — R040 Epistaxis: Secondary | ICD-10-CM | POA: Diagnosis not present

## 2023-06-12 DIAGNOSIS — Z952 Presence of prosthetic heart valve: Secondary | ICD-10-CM | POA: Diagnosis not present

## 2023-06-12 NOTE — Progress Notes (Deleted)
    SUBJECTIVE:   CHIEF COMPLAINT / HPI:   Nosebleeds Had 2 episodes of nosebleeds recently that were self resolved.  She scratched the inside of her nose on one of the episodes and noticed some blood dripping on her hand.  After using some tissues and pinching her nose, it stopped.  She was asked to come in for further evaluation given she is currently on Coumadin  after having a mitral valve replacement.  PERTINENT  PMH / PSH: Mitral valve replacement at [facility] 05/2023 with INR goal 2.5-3.5, developmental delay, ADHD  OBJECTIVE:   BP 121/82   Pulse (!) 107   Ht 5' 6 (1.676 m)   Wt 104 lb 12.8 oz (47.5 kg)   LMP 04/13/2023   SpO2 100%   BMI 16.92 kg/m   General: Alert and oriented, in NAD Skin: Warm, dry, and intact without lesions HEENT: NCAT, EOM grossly normal, midline nasal septum without significant swollen nasal turbinates, scant dried blood on left nasal septum without active bleed, no congestion Cardiac: Regular rhythm, no murmurs rubs or gallops appreciated Respiratory: CTAB, breathing and speaking comfortably on RA Extremities: Moves all extremities grossly equally Neurological: No gross focal deficit Psychiatric: Appropriate mood and affect   ASSESSMENT/PLAN:   Epistaxis Not currently experiencing an episode.  Reassuringly self resolved.  Recommended pinching bridge of nose and leaning forward.  Also use 2 sprays of Afrin into affected nostril.  Can also use nasal tampons and tissues with acute bleed.  With increasing Coumadin  dosing as below to get to goal INR, discussed going to ED should she have recurrent nosebleeds that are refractory to above management.  History of mitral valve replacement On Coumadin .  Patient instructed to increase dosing of Coumadin  per Dr. Koval and lab lead Lamar.  Recheck INR in 1 week.   Health maintenance Patient will follow-up with me in 1 week for headaches and acne.   Follow-up Pap smear and vaccines with PCP.  Stuart Redo,  MD [facility]

## 2023-06-12 NOTE — Assessment & Plan Note (Signed)
 On Coumadin.  Patient instructed to increase dosing of Coumadin per Dr. Raymondo Band and lab lead Molly Maduro.  Recheck INR in 1 week.

## 2023-06-12 NOTE — Assessment & Plan Note (Signed)
 Not currently experiencing an episode.  Reassuringly self resolved.  Recommended pinching bridge of nose and leaning forward.  Also use 2 sprays of Afrin into affected nostril.  Can also use nasal tampons and tissues with acute bleed.  With increasing Coumadin  dosing as below to get to goal INR, discussed going to ED should she have recurrent nosebleeds that are refractory to above management.

## 2023-06-12 NOTE — Patient Instructions (Signed)
 I am happy to hear that your nosebleeds have stopped. If they occur again, be sure to pinch the bridge of your nose until forward. You can also use to sprays of Afrin nasal spray (generic for oxymetazoline) into the bleeding nostril. You can also use tissues or nasal tampons to help reduce bleeding. If the bleeding continues after all the steps, recommend going to the emergency room. Come back in about 1 week to get your INR rechecked to make sure you are the correct Coumadin  dose.

## 2023-06-12 NOTE — Progress Notes (Signed)
    SUBJECTIVE:   CHIEF COMPLAINT / HPI:   Nosebleeds Had 2 episodes of nosebleeds recently that were self resolved.  She scratched the inside of her nose on one of the episodes and noticed some blood dripping on her hand.  After using some tissues and pinching her nose, it stopped.  She was asked to come in for further evaluation given she is currently on Coumadin  after having a mitral valve replacement.  PERTINENT  PMH / PSH: Mitral valve replacement at [facility] 05/2023 with INR goal 2.5-3.5, developmental delay, ADHD  OBJECTIVE:   BP 121/82   Pulse (!) 107   Ht 5' 6 (1.676 m)   Wt 104 lb 12.8 oz (47.5 kg)   LMP 04/13/2023   SpO2 100%   BMI 16.92 kg/m   General: Alert and oriented, in NAD Skin: Warm, dry, and intact without lesions HEENT: NCAT, EOM grossly normal, midline nasal septum without significant swollen nasal turbinates, scant dried blood on left nasal septum without active bleed, no congestion Cardiac: Regular rhythm, no murmurs rubs or gallops appreciated Respiratory: CTAB, breathing and speaking comfortably on RA Extremities: Moves all extremities grossly equally Neurological: No gross focal deficit Psychiatric: Appropriate mood and affect   ASSESSMENT/PLAN:   Epistaxis Not currently experiencing an episode.  Reassuringly self resolved.  Recommended pinching bridge of nose and leaning forward.  Also use 2 sprays of Afrin into affected nostril.  Can also use nasal tampons and tissues with acute bleed.  With increasing Coumadin  dosing as below to get to goal INR, discussed going to ED should she have recurrent nosebleeds that are refractory to above management.  History of mitral valve replacement On Coumadin .  Patient instructed to increase dosing of Coumadin  per Dr. Koval and lab lead Lamar.  Recheck INR in 1 week.   Health maintenance Patient will follow-up with me in 1 week for headaches and acne.   Follow-up Pap smear and vaccines with PCP.  Stuart Redo,  MD [facility]

## 2023-06-14 ENCOUNTER — Other Ambulatory Visit: Payer: Self-pay

## 2023-06-14 DIAGNOSIS — Z952 Presence of prosthetic heart valve: Secondary | ICD-10-CM | POA: Diagnosis not present

## 2023-06-14 DIAGNOSIS — R918 Other nonspecific abnormal finding of lung field: Secondary | ICD-10-CM | POA: Diagnosis not present

## 2023-06-18 ENCOUNTER — Ambulatory Visit (INDEPENDENT_AMBULATORY_CARE_PROVIDER_SITE_OTHER): Payer: 59 | Admitting: Family Medicine

## 2023-06-18 ENCOUNTER — Encounter: Payer: Self-pay | Admitting: Family Medicine

## 2023-06-18 ENCOUNTER — Other Ambulatory Visit (INDEPENDENT_AMBULATORY_CARE_PROVIDER_SITE_OTHER): Payer: 59

## 2023-06-18 VITALS — BP 109/76 | HR 107 | Wt 106.6 lb

## 2023-06-18 DIAGNOSIS — G43109 Migraine with aura, not intractable, without status migrainosus: Secondary | ICD-10-CM

## 2023-06-18 DIAGNOSIS — Z7901 Long term (current) use of anticoagulants: Secondary | ICD-10-CM | POA: Diagnosis not present

## 2023-06-18 DIAGNOSIS — L700 Acne vulgaris: Secondary | ICD-10-CM

## 2023-06-18 DIAGNOSIS — Z952 Presence of prosthetic heart valve: Secondary | ICD-10-CM | POA: Diagnosis not present

## 2023-06-18 LAB — POCT INR: INR: 2.5 (ref 2.0–3.0)

## 2023-06-18 NOTE — Progress Notes (Signed)
    SUBJECTIVE:   CHIEF COMPLAINT / HPI:   Headaches Once or twice a week and last for 5-10 minutes after tylenol . Sees some colors in her vision before the headaches start. Sometimes right after she wakes up. Going on since 23 years old. Used to take motrin  before her heart problem but now takes tylenol  1-2 tablets. This helps with the headaches. Has never had to go into a dark room with complete quiet for the pain. No eye watering or nose running. Mostly around the eye on one side. No numbness or weakness with these. Has been drinking more water lately that may have helped the frequency a little bit.  Acne Been dealing with for a while. Has been using CeraVe cleanser without much help. Has not tried benzoyl peroxide or tretinoin. She has a nexplanon  in.  MVR s/p repair on Coumadin  Seen today with lab and pharmacist. INR 2.5. Staying on current dosing. Scheduled for recheck in 1 week.  PERTINENT  PMH / PSH: Developmental delay, ADHD  OBJECTIVE:   BP 109/76   Pulse (!) 107   Wt 106 lb 9.6 oz (48.4 kg)   LMP 04/13/2023   SpO2 99%   BMI 17.21 kg/m   General: Alert and oriented, in NAD Skin: Warm, dry, and intact without lesions HEENT: NCAT, EOM grossly normal, midline nasal septum Cardiac: RRR, no m/r/g appreciated Respiratory: CTAB, breathing and speaking comfortably on RA Abdominal: Soft, nontender, nondistended, normoactive bowel sounds Extremities: Moves all extremities grossly equally Neurological: No gross focal deficit Psychiatric: Appropriate mood and affect   ASSESSMENT/PLAN:   Migraines Unilateral headaches with preceding aura likely migraines.  Reassuring neurologic exam today.  Would not use propranolol as prophylaxis given patient is currently on metoprolol  by cardiology after mitral valve repair.  Discussed adding topiramate for prophylaxis; however, patient elects to continue Tylenol  for now and will let us  know if they get worse.  Given easily treated with  Tylenol , will hold off on sumatriptan for now.  Follow-up as needed.  Acne Present on the face.  Has been using salicylic acid cleanser only.  Recommended trying cleansers also with benzyl peroxide.  Can also try adapalene gel since she has Nexplanon  in.  Follow-up as needed.  History of mitral valve replacement INR within goal today.  Will refill Coumadin  2.5 mg daily.  Will recheck again in 1 week.  Patient also asked about various vitamins that she could take while on Coumadin .  Instructed to be careful with intake of green leafy vegetables given vitamin K content.  Advised to obtain other vitamins and minerals, such as vitamin D and calcium, from her diet, especially in the absence of symptoms concerning for deficiency.   Health maintenance Consider health maintenance items at next visit with PCP.  Stuart Redo, MD [facility]

## 2023-06-18 NOTE — Patient Instructions (Addendum)
 Let me know if you want to start topiramate for your headaches.  Be sure to take a cleanser with benzoyl peroxide. You can also use adapelene gel over the counter. Come back in 1 week for INR recheck. I would recommend getting your vitamin D from the sun and calcium in milk.

## 2023-06-19 DIAGNOSIS — G43909 Migraine, unspecified, not intractable, without status migrainosus: Secondary | ICD-10-CM | POA: Insufficient documentation

## 2023-06-19 DIAGNOSIS — L709 Acne, unspecified: Secondary | ICD-10-CM | POA: Insufficient documentation

## 2023-06-19 MED ORDER — WARFARIN SODIUM 2.5 MG PO TABS
2.5000 mg | ORAL_TABLET | Freq: Every day | ORAL | 11 refills | Status: DC
  Filled 2023-08-01: qty 30, 30d supply, fill #0
  Filled 2023-08-13: qty 30, 30d supply, fill #1

## 2023-06-19 NOTE — Assessment & Plan Note (Signed)
 Present on the face.  Has been using salicylic acid cleanser only.  Recommended trying cleansers also with benzyl peroxide.  Can also try adapalene gel since she has Nexplanon in.  Follow-up as needed.

## 2023-06-19 NOTE — Assessment & Plan Note (Signed)
 Unilateral headaches with preceding aura likely migraines.  Reassuring neurologic exam today.  Would not use propranolol as prophylaxis given patient is currently on metoprolol  by cardiology after mitral valve repair.  Discussed adding topiramate for prophylaxis; however, patient elects to continue Tylenol  for now and will let us  know if they get worse.  Given easily treated with Tylenol , will hold off on sumatriptan for now.  Follow-up as needed.

## 2023-06-19 NOTE — Assessment & Plan Note (Addendum)
 INR within goal today.  Will refill Coumadin  2.5 mg daily.  Will recheck again in 1 week.  Patient also asked about various vitamins that she could take while on Coumadin .  Instructed to be careful with intake of green leafy vegetables given vitamin K content.  Advised to obtain other vitamins and minerals, such as vitamin D and calcium, from her diet, especially in the absence of symptoms concerning for deficiency.

## 2023-06-22 ENCOUNTER — Ambulatory Visit: Payer: 59 | Admitting: Cardiology

## 2023-06-22 DIAGNOSIS — Z309 Encounter for contraceptive management, unspecified: Secondary | ICD-10-CM | POA: Diagnosis not present

## 2023-06-22 DIAGNOSIS — Z3046 Encounter for surveillance of implantable subdermal contraceptive: Secondary | ICD-10-CM | POA: Diagnosis not present

## 2023-06-25 ENCOUNTER — Other Ambulatory Visit: Payer: 59

## 2023-06-25 DIAGNOSIS — Z7901 Long term (current) use of anticoagulants: Secondary | ICD-10-CM | POA: Diagnosis not present

## 2023-06-25 DIAGNOSIS — Z952 Presence of prosthetic heart valve: Secondary | ICD-10-CM | POA: Diagnosis not present

## 2023-06-25 LAB — POCT INR: INR: 3 (ref 2.0–3.0)

## 2023-06-25 NOTE — Progress Notes (Deleted)
Cardiology Office Note:  .   Date:  06/25/2023  ID:  Anita Salazar, DOB Jan 15, 2001, MRN [id number] PCP: Glendale Chard, DO  [facility] Providers Cardiologist:  Thomasene Ripple, DO   History of Present Illness: Anita Salazar Kitchen   Anita Salazar is a 23 y.o. female with a past medical history of mitral valve prolapse with mitral valve regurgitation, ADHD. Patient is followed by Dr. Servando Salina and presents today for follow up of mitral valve regurgitation.   Patient was previously followed by [facility] pediatric cardiology since she was 14. She was seen by Dr. Antoine Poche on 04/13/22 to reestablish cardiology care. Echocardiogram showed that the mitral valve was abnormal with mitral valve prolapse, at least moderate mitral valve regurgitation. EF was 60-65% and there were no regional wall motion abnormalities. RV function normal.  Patient mentioned wanting to get pregnant. She was referred to Dr. Servando Salina and was seen on 05/08/22. She was counseled on the risks of pregnancy with severe MR. Though MR could be well tolerated in pregnancy, Dr. Servando Salina recommended quantitative understanding of the MR.  She was sent for cardiac MRI that was completed 02/21/23 and showed mild-moderate dilation of the LV with normal LV systolic function (EF 55%). There was normal RV size with normal RV systolic function. There was significant left atrial dilation with severe mitral regurgitation.   Patient underwent TEE on 03/13/23 that showed EF 60-65%, normal RV function, severe LA dilation, mild restriction of the MV posterior leaflet and restriction of the tip of the anterior leaflet. There was severe MR. Patient was initially referred to CV surgery in [place]. However, she preferred to see [facility].   She was seen by [facility] cardiology and underwent mitral valve replacement with a st Jude Mechanical valve on 05/21/23. She was discharged home on coumadin with INR goal 2.5-3.5. She was seen by [facility] Cardiology on 06/14/23 for follow up.  Mitral Valve  Prolapse  Severe MR  S/p MVR  - Patient had been followed by [facility] pediatric cardiology since she was 23 years old. Later established care with Dr. Servando Salina- underwent cardiac MRI and TEE that confirmed severe MR  - Patient was referred to [facility] CT surgery - underwent MVR on 05/20/24 with St. Jude mechanical valve  - On coumadin with goal INR 2.5-3.5  -  - Ordered echo for monitoring as she is >6 weeks post replacement   ROS: ***  Studies Reviewed: .        *** Risk Assessment/Calculations:   {Does this patient have ATRIAL FIBRILLATION?[phone]} No BP recorded.  {Refresh Note OR Click here to enter BP  :1}***       Physical Exam:   VS:  There were no vitals taken for this visit.   Wt Readings from Last 3 Encounters:  06/18/23 106 lb 9.6 oz (48.4 kg)  06/12/23 104 lb 12.8 oz (47.5 kg)  05/28/23 102 lb 12.8 oz (46.6 kg)    GEN: Well nourished, well developed in no acute distress NECK: No JVD; No carotid bruits CARDIAC: ***RRR, no murmurs, rubs, gallops RESPIRATORY:  Clear to auscultation without rales, wheezing or rhonchi  ABDOMEN: Soft, non-tender, non-distended EXTREMITIES:  No edema; No deformity   ASSESSMENT AND PLAN: .   *** {The patient has an active order for outpatient cardiac rehabilitation.   Please indicate if the patient is ready to start. Do NOT delete this.  It will auto delete.  Refresh note, then sign.  Click here to document readiness and see contraindications.  :1}  Cardiac Rehabilitation Eligibility Assessment      {Are you ordering a CV Procedure (e.g. stress test, cath, DCCV, TEE, etc)?   Press F2        :[id number]}  Dispo: ***  Signed, Jonita Albee, PA-C

## 2023-06-29 ENCOUNTER — Ambulatory Visit (INDEPENDENT_AMBULATORY_CARE_PROVIDER_SITE_OTHER): Payer: 59 | Admitting: Cardiology

## 2023-06-29 ENCOUNTER — Encounter: Payer: Self-pay | Admitting: Cardiology

## 2023-06-29 ENCOUNTER — Ambulatory Visit: Payer: 59 | Admitting: Cardiology

## 2023-06-29 VITALS — BP 120/90 | HR 109 | Ht 67.0 in | Wt 107.1 lb

## 2023-06-29 DIAGNOSIS — Z8249 Family history of ischemic heart disease and other diseases of the circulatory system: Secondary | ICD-10-CM | POA: Diagnosis not present

## 2023-06-29 DIAGNOSIS — Z3169 Encounter for other general counseling and advice on procreation: Secondary | ICD-10-CM | POA: Diagnosis not present

## 2023-06-29 DIAGNOSIS — Z5181 Encounter for therapeutic drug level monitoring: Secondary | ICD-10-CM

## 2023-06-29 DIAGNOSIS — Z952 Presence of prosthetic heart valve: Secondary | ICD-10-CM

## 2023-06-29 DIAGNOSIS — Z7901 Long term (current) use of anticoagulants: Secondary | ICD-10-CM

## 2023-06-29 NOTE — Patient Instructions (Addendum)
Medication Instructions:  Your physician recommends that you continue on your current medications as directed. Please refer to the Current Medication list given to you today.  *If you need a refill on your cardiac medications before your next appointment, please call your pharmacy*   Follow-Up: At [facility], you and your health needs are our priority.  As part of our continuing mission to provide you with exceptional heart care, we have created designated Provider Care Teams.  These Care Teams include your primary Cardiologist (physician) and Advanced Practice Providers (APPs -  Physician Assistants and Nurse Practitioners) who all work together to provide you with the care you need, when you need it.  Your next appointment:   4 month(s)  Provider:   Thomasene Ripple  [facility] [address] [place], [place] [address]

## 2023-06-30 NOTE — Progress Notes (Unsigned)
Cardio-Obstetrics Clinic  Follow Up Note   Date:  06/30/2023   ID:  Anita Salazar, DOB [birth year], MRN [id number]  PCP:  Glendale Chard, DO   [facility] Providers Cardiologist:  Thomasene Ripple, DO  Electrophysiologist:  None   { Click to update primary MD,subspecialty MD or APP then REFRESH:1}     Referring MD: Glendale Chard, DO   Chief Complaint: " I am doing well"  History of Present Illness:    Anita Salazar is a 23 y.o. female [No obstetric history on file.] who returns for follow up for the first time after her mitral valve replacement with mechanical valve,   Her last visit with me was 03/2023 at that time I advised the patient not to conceive until she has her severe mitral regurgitation address due severe primary mitral regurgitation. She was initially refer to CT surgery at [facility] who recommended waiting and monitoring and reported high likelihood for mitral valve repair. She then started to experience shortness of breath,  and fatigue and with her desire to conceive and her valve disease making her high risk for potential complication I send the patient to [facility] for a second opinion  - she was deemed appropriate to proceed with a MVR.  She underwent surgery in December 2024.   She is her today for follow up, improving from surgery. She offers no complaints. She has been following our family practise clinic for her INR.   Prior CV Studies Reviewed: The following studies were reviewed today: Echo in December at [facility]  Past Medical History:  Diagnosis Date   Mitral regurgitation    MVP (mitral valve prolapse)     Past Surgical History:  Procedure Laterality Date   None     TEE WITHOUT CARDIOVERSION N/A 03/13/2023   Procedure: TRANSESOPHAGEAL ECHOCARDIOGRAM;  Surgeon: Jodelle Red, MD;  Location: [facility] INVASIVE CV LAB;  Service: Cardiovascular;  Laterality: N/A;   { Click here to update PMH, PSH, OB Hx then refresh note  :1}    OB History   No obstetric history on file.     { Click here to update OB Charting then refresh note  :1}    Current Medications: Current Meds  Medication Sig   acetaminophen (TYLENOL) 500 MG tablet Take 500-1,000 mg by mouth every 6 (six) hours as needed.   aspirin 81 MG chewable tablet Chew 81 mg by mouth daily.   ibuprofen (ADVIL) 200 MG tablet Take 200 mg by mouth every 4 (four) hours as needed for headache.   metoprolol tartrate (LOPRESSOR) 25 MG tablet Take 25 mg by mouth 2 (two) times daily.   norethindrone (MICRONOR) 0.35 MG tablet Take 1 tablet by mouth daily.   warfarin (COUMADIN) 2.5 MG tablet Take 1 tablet (2.5 mg total) by mouth at bedtime.     Allergies:   Patient has no known allergies.   Social History   Socioeconomic History   Marital status: Single    Spouse name: Not on file   Number of children: Not on file   Years of education: Not on file   Highest education level: Not on file  Occupational History   Not on file  Tobacco Use   Smoking status: Never   Smokeless tobacco: Never  Substance and Sexual Activity   Alcohol use: No   Drug use: No   Sexual activity: Not on file  Other Topics Concern   Not on file  Social History Narrative   Lives with  boyfriend.    Social Drivers of Corporate investment banker Strain: Not on file  Food Insecurity: Not on file  Transportation Needs: No Transportation Needs (05/21/2023)   Received from [facility] - Transportation    In the past 12 months, has lack of transportation kept you from medical appointments or from getting medications?: No    Lack of Transportation (Non-Medical): No  Physical Activity: Not on file  Stress: Not on file  Social Connections: Not on file  { Click here to update SDOH then refresh :1}    Family History  Problem Relation Age of Onset   Diabetes Mother    Heart disease Father        No details   { Click here to update FH then refresh note     :1}   ROS:   Please see the history of present illness.    none All other systems reviewed and are negative.   Labs/EKG Reviewed:    EKG:  None today  Recent Labs: 03/07/2023: BUN 9; Creatinine, Ser 0.83; Hemoglobin 12.8; Magnesium 2.2; Platelets 259; Potassium 4.6; Sodium 140   Recent Lipid Panel No results found for: "CHOL", "TRIG", "HDL", "CHOLHDL", "LDLCALC", "LDLDIRECT"  Physical Exam:    VS:  BP (!) 120/90 (BP Location: Right Arm, Patient Position: Sitting, Cuff Size: Normal)   Pulse (!) 109   Ht 5\' 7"  (1.702 m)   Wt 107 lb 1.6 oz (48.6 kg)   SpO2 98%   BMI 16.77 kg/m     Wt Readings from Last 3 Encounters:  06/29/23 107 lb 1.6 oz (48.6 kg)  06/18/23 106 lb 9.6 oz (48.4 kg)  06/12/23 104 lb 12.8 oz (47.5 kg)     GEN:  Well nourished, well developed in no acute distress HEENT: Normal NECK: No JVD; No carotid bruits LYMPHATICS: No lymphadenopathy CARDIAC: RRR, mechanical click, noted holosystolic murmurs, rubs, gallops RESPIRATORY:  Clear to auscultation without rales, wheezing or rhonchi  ABDOMEN: Soft, non-tender, non-distended MUSCULOSKELETAL:  No edema; No deformity  SKIN: Warm and dry NEUROLOGIC:  Alert and oriented x 3 PSYCHIATRIC:  Normal affect    Risk Assessment/Risk Calculators:   { Click to calculate CARPREG II - THEN refresh note :1}    { Click to caclulate Mod WHO Class of CV Risk - THEN refresh note :1}     { Click for CHADS2VASc Score - THEN Refresh Note    :[id number]}      ASSESSMENT & PLAN:    Status post mechanical mitral valve due to primary severe mitral regurgitation Chronic anticoagulation  Preconception counseling  Mildly depressed ejection fraction - echo from 05/2023 at [facility].   She has recovered well from surgery. Surgical scar healed.  Currently on warfarin 2.5 mg daily for INR target 3 ( 2.5 - 3.5). she is aware of the dietary restrictions.  Unfortunately post surgery EF slightly lower - I will get a repeat echo sooner,  prior to her next visit for which will be in 4 months.   She still desires to have a baby in the near future. I explained to the patient that this will be a high risk pregnancy with being on anticoagulation. We will discuss more at her follow up visit in 4 months     Patient Instructions  Medication Instructions:  Your physician recommends that you continue on your current medications as directed. Please refer to the Current Medication list given to you today.  *If you  need a refill on your cardiac medications before your next appointment, please call your pharmacy*   Follow-Up: At [facility], you and your health needs are our priority.  As part of our continuing mission to provide you with exceptional heart care, we have created designated Provider Care Teams.  These Care Teams include your primary Cardiologist (physician) and Advanced Practice Providers (APPs -  Physician Assistants and Nurse Practitioners) who all work together to provide you with the care you need, when you need it.  Your next appointment:   4 month(s)  Provider:   [facility] [address] [place], [place] [address]    Dispo:  No follow-ups on file.   Medication Adjustments/Labs and Tests Ordered: Current medicines are reviewed at length with the patient today.  Concerns regarding medicines are outlined above.  Tests Ordered: No orders of the defined types were placed in this encounter.  Medication Changes: No orders of the defined types were placed in this encounter.

## 2023-07-02 ENCOUNTER — Other Ambulatory Visit: Payer: 59

## 2023-07-03 ENCOUNTER — Other Ambulatory Visit: Payer: Self-pay | Admitting: *Deleted

## 2023-07-03 ENCOUNTER — Encounter: Payer: Self-pay | Admitting: *Deleted

## 2023-07-03 ENCOUNTER — Other Ambulatory Visit: Payer: 59

## 2023-07-03 ENCOUNTER — Other Ambulatory Visit: Payer: Self-pay

## 2023-07-03 DIAGNOSIS — Z7901 Long term (current) use of anticoagulants: Secondary | ICD-10-CM

## 2023-07-03 DIAGNOSIS — Z952 Presence of prosthetic heart valve: Secondary | ICD-10-CM

## 2023-07-03 LAB — POCT INR: INR: 4 — AB (ref 2.0–3.0)

## 2023-07-03 NOTE — Addendum Note (Signed)
Addended by: Reynolds Bowl on: 07/03/2023 12:00 PM   Modules accepted: Orders

## 2023-07-03 NOTE — Progress Notes (Signed)
Patient thought she was taking 5 mg coumadin tablets and her dosing instructions were based off of that information. I discovered she in fact has the 2.5 mg tablets when requesting a refill. Discussed with patient that she should continue with dosing instructions which was based off of number of tablets and not mg. She will return in 1 week for follow up INR. Doris Cheadle Boby Eyer

## 2023-07-10 ENCOUNTER — Ambulatory Visit: Payer: 59

## 2023-07-10 ENCOUNTER — Other Ambulatory Visit (INDEPENDENT_AMBULATORY_CARE_PROVIDER_SITE_OTHER): Payer: 59

## 2023-07-10 ENCOUNTER — Other Ambulatory Visit: Payer: Self-pay | Admitting: *Deleted

## 2023-07-10 ENCOUNTER — Other Ambulatory Visit: Payer: 59

## 2023-07-10 DIAGNOSIS — Z7901 Long term (current) use of anticoagulants: Secondary | ICD-10-CM

## 2023-07-10 DIAGNOSIS — Z952 Presence of prosthetic heart valve: Secondary | ICD-10-CM

## 2023-07-10 LAB — POCT INR: INR: 2.7 (ref 2.0–3.0)

## 2023-07-11 ENCOUNTER — Ambulatory Visit: Payer: Self-pay

## 2023-07-11 NOTE — Progress Notes (Deleted)
    SUBJECTIVE:   CHIEF COMPLAINT / HPI:   ***  PERTINENT  PMH / PSH: Acne, Hx of mitral valve replacement  OBJECTIVE:   There were no vitals taken for this visit.  ***  ASSESSMENT/PLAN:   Assessment & Plan  No follow-ups on file.  Ozell Provencal, MD [facility]

## 2023-07-12 MED ORDER — METOPROLOL TARTRATE 25 MG PO TABS: 25.0000 mg | ORAL_TABLET | Freq: Two times a day (BID) | ORAL | 3 refills | Status: DC

## 2023-07-17 ENCOUNTER — Other Ambulatory Visit: Payer: 59

## 2023-07-17 DIAGNOSIS — Z952 Presence of prosthetic heart valve: Secondary | ICD-10-CM | POA: Diagnosis not present

## 2023-07-17 DIAGNOSIS — Z7901 Long term (current) use of anticoagulants: Secondary | ICD-10-CM

## 2023-07-17 LAB — POCT INR: INR: 1.8 — AB (ref 2.0–3.0)

## 2023-07-24 ENCOUNTER — Other Ambulatory Visit (HOSPITAL_COMMUNITY): Payer: Self-pay

## 2023-07-25 ENCOUNTER — Other Ambulatory Visit (HOSPITAL_COMMUNITY): Payer: Self-pay

## 2023-07-25 ENCOUNTER — Telehealth: Payer: Self-pay

## 2023-07-25 MED ORDER — METOPROLOL TARTRATE 25 MG PO TABS
25.0000 mg | ORAL_TABLET | Freq: Two times a day (BID) | ORAL | 3 refills | Status: DC
  Filled 2023-07-25: qty 180, 90d supply, fill #0
  Filled 2023-10-20 – 2023-11-05 (×2): qty 180, 90d supply, fill #1

## 2023-07-26 ENCOUNTER — Ambulatory Visit: Payer: 59

## 2023-07-26 ENCOUNTER — Other Ambulatory Visit: Payer: 59

## 2023-08-01 ENCOUNTER — Other Ambulatory Visit (HOSPITAL_COMMUNITY): Payer: Self-pay

## 2023-08-01 ENCOUNTER — Ambulatory Visit: Payer: 59

## 2023-08-01 ENCOUNTER — Telehealth: Payer: Self-pay | Admitting: *Deleted

## 2023-08-01 ENCOUNTER — Other Ambulatory Visit (INDEPENDENT_AMBULATORY_CARE_PROVIDER_SITE_OTHER): Payer: 59

## 2023-08-01 DIAGNOSIS — Z7901 Long term (current) use of anticoagulants: Secondary | ICD-10-CM

## 2023-08-01 DIAGNOSIS — Z952 Presence of prosthetic heart valve: Secondary | ICD-10-CM | POA: Diagnosis not present

## 2023-08-01 LAB — POCT INR: INR: 2.5 (ref 2.0–3.0)

## 2023-08-01 NOTE — Progress Notes (Deleted)
    SUBJECTIVE:   CHIEF COMPLAINT / HPI: Acne  ***  PERTINENT  PMH / PSH: Prosthetic Mitral Valve, Acne  OBJECTIVE:   There were no vitals taken for this visit.  ***  ASSESSMENT/PLAN:   Assessment & Plan  No follow-ups on file.  Celine Mans, MD [facility]

## 2023-08-01 NOTE — Telephone Encounter (Signed)
 Patient missed office visit today, I called and spoke with the patient. I told her it was important we check her INR today. It has not been checked since the last dose increase. Patient states she can come in today by 12:00. Lab appointment scheduled. Doris Cheadle Danie Diehl

## 2023-08-02 ENCOUNTER — Ambulatory Visit: Payer: 59

## 2023-08-07 ENCOUNTER — Telehealth: Payer: Self-pay

## 2023-08-07 NOTE — Telephone Encounter (Signed)
 Patient calls nurse line reporting nose bleeds.   She reports 2 episodes since yesterday afternoon. She reports she feels this was triggered due to having the air on over the weekend. She reports compliance with Warfarin.   She denies any chest pains, shortness of breath, dizziness or vision changes.   She reports the episodes last ~10 minutes and are easily controllable.   Advised a humidifier if able.  Apt scheduled for tomorrow for evaluation.   ED precautions discussed in the meantime.

## 2023-08-08 ENCOUNTER — Ambulatory Visit

## 2023-08-08 VITALS — BP 115/81 | HR 111 | Ht 66.0 in | Wt 103.8 lb

## 2023-08-08 DIAGNOSIS — Z7901 Long term (current) use of anticoagulants: Secondary | ICD-10-CM | POA: Diagnosis not present

## 2023-08-08 DIAGNOSIS — R040 Epistaxis: Secondary | ICD-10-CM

## 2023-08-08 DIAGNOSIS — D649 Anemia, unspecified: Secondary | ICD-10-CM | POA: Diagnosis not present

## 2023-08-08 LAB — POCT INR: INR: 4 — AB (ref 2.0–3.0)

## 2023-08-08 MED ORDER — FEXOFENADINE HCL 180 MG PO TABS: 180.0000 mg | ORAL_TABLET | Freq: Every day | ORAL | 0 refills | Status: AC

## 2023-08-08 NOTE — Progress Notes (Signed)
  SUBJECTIVE:   CHIEF COMPLAINT / HPI:   Epistaxis: Patient called yesterday and reported 2 episodes of nosebleeding.  She is on warfarin and reports adherence to this.  She missed her last INR check.  Episodes lasted approximately 10 minutes and were easily controllable. She's been staying in an area with more AC. Her nosebleeds started Monday evening. Tuesday evening she felt blood going down the back of her throat. She applies pressure and leans forward. Her nosebleed recently was left sided but she cannot recall if it is always the left side. She uses saline nasal spray. She does not use any other nasal sprays. Does not have a humidifier where she sleeps. She has also recently been sick and blowing her nose more often.   Secondarily, she would like to know if it is safe to get tattoos while on warfarin.  PERTINENT  PMH / PSH: MV replacement on warfarin, ADHD  OBJECTIVE:  BP 115/81   Pulse (!) 111   Ht 5\' 6"  (1.676 m)   Wt 103 lb 12.8 oz (47.1 kg)   LMP 06/09/2023   SpO2 100%   BMI 16.75 kg/m  General: Well-appearing, NAD HEENT: Erythematous and enlarged inferior nasal turbinates, midline septum, no appreciable points of bleeding CV: Tachycardic, normal rhythm Pulm: Normal WOB  ASSESSMENT/PLAN:   Assessment & Plan Epistaxis Chronic, including before she was on warfarin.  Turbinates are enlarged although suspect this is related to recent sickness rather than pathological change.  Advised continuation of nasal spray, initiation of Allegra daily and use of Vaseline 2-3 times a day. Anemia, unspecified type Reviewed prior labs, recent CBC in January of hemoglobin 11.1.  She has not been assessed for iron deficiency.  Will recheck for anemia causes and hemoglobin today given recent nosebleeds.  Warfarin dosing adjusted, recheck INR in 1 week. Return in about 3 weeks (around 08/29/2023) for Epistaxis and anemia follow-up. Shelby Mattocks, DO 08/08/2023, 9:43 AM PGY-3, [facility]

## 2023-08-08 NOTE — Patient Instructions (Signed)
 It was great to see you today! Thank you for choosing [facility] for your primary care.  Today we addressed: Nosebleeds: Recommend applying Vaseline 2-3 times a day.  Continue to use nasal saline spray.  Recommend taking Allegra daily.  I will send a prescription for this. Anemia: We have adjusted your warfarin dose.  Please always pay attention to our schedule rather than the medication bottle.  I am checking blood work on you as well to see if you would be a good candidate for iron replacement.  If you haven't already, sign up for My Chart to have easy access to your labs results, and communication with your primary care physician. We are checking some labs today. If they are abnormal, I will call you. If they are normal, I will send you a MyChart message (if it is active) or a letter in the mail. If you do not hear about your labs in the next 2 weeks, please call the office. Return in about 3 weeks (around 08/29/2023) for Epistaxis and anemia follow-up. Please arrive 15 minutes before your appointment to ensure smooth check in process.  We appreciate your efforts in making this happen.  Thank you for allowing me to participate in your care, Shelby Mattocks, DO 08/08/2023, 9:43 AM PGY-3, [facility]

## 2023-08-08 NOTE — Assessment & Plan Note (Signed)
 Chronic, including before she was on warfarin.  Turbinates are enlarged although suspect this is related to recent sickness rather than pathological change.  Advised continuation of nasal spray, initiation of Allegra daily and use of Vaseline 2-3 times a day.

## 2023-08-10 ENCOUNTER — Encounter: Payer: Self-pay | Admitting: Student

## 2023-08-10 LAB — CBC
Hemoglobin: 12.4 g/dL (ref 11.1–15.9)
MCH: 27.7 pg (ref 26.6–33.0)
MCHC: 32.8 g/dL (ref 31.5–35.7)
MCV: 85 fL (ref 79–97)
Platelets: 414 10*3/uL (ref 150–450)
RBC: 4.47 x10E6/uL (ref 3.77–5.28)
RDW: 13.6 % (ref 11.7–15.4)
WBC: 6.6 10*3/uL (ref 3.4–10.8)

## 2023-08-10 LAB — ANEMIA PANEL
Ferritin: 101 ng/mL (ref 15–150)
Folate, Hemolysate: 363 ng/mL
Folate, RBC: 960 ng/mL (ref >498)
Hematocrit: 37.8 % (ref 34.0–46.6)
Iron Saturation: 13 % — ABNORMAL LOW (ref 15–55)
Iron: 36 ug/dL (ref 27–159)
Retic Ct Pct: 1.2 % (ref 0.6–2.6)
Total Iron Binding Capacity: 288 ug/dL (ref 250–450)
UIBC: 252 ug/dL (ref 131–425)
Vitamin B-12: 829 pg/mL (ref 232–1245)

## 2023-08-13 ENCOUNTER — Other Ambulatory Visit (HOSPITAL_COMMUNITY): Payer: Self-pay

## 2023-08-14 ENCOUNTER — Other Ambulatory Visit (INDEPENDENT_AMBULATORY_CARE_PROVIDER_SITE_OTHER)

## 2023-08-14 ENCOUNTER — Other Ambulatory Visit: Payer: 59

## 2023-08-14 DIAGNOSIS — Z7901 Long term (current) use of anticoagulants: Secondary | ICD-10-CM

## 2023-08-14 DIAGNOSIS — Z952 Presence of prosthetic heart valve: Secondary | ICD-10-CM | POA: Diagnosis not present

## 2023-08-14 LAB — POCT INR: INR: 4 — AB (ref 2.0–3.0)

## 2023-08-15 ENCOUNTER — Ambulatory Visit (INDEPENDENT_AMBULATORY_CARE_PROVIDER_SITE_OTHER): Admitting: Student

## 2023-08-15 VITALS — BP 107/76 | HR 91 | Ht 67.0 in | Wt 103.0 lb

## 2023-08-15 DIAGNOSIS — N926 Irregular menstruation, unspecified: Secondary | ICD-10-CM

## 2023-08-15 DIAGNOSIS — L700 Acne vulgaris: Secondary | ICD-10-CM | POA: Diagnosis not present

## 2023-08-15 DIAGNOSIS — Z789 Other specified health status: Secondary | ICD-10-CM

## 2023-08-15 LAB — POCT URINE PREGNANCY: Preg Test, Ur: NEGATIVE

## 2023-08-15 NOTE — Progress Notes (Signed)
    SUBJECTIVE:   CHIEF COMPLAINT / HPI:   Anita Salazar is a 23 y.o. female  presenting for acne.  She has tried some over-the-counter products.  She reports only trying salicylic acid.  She has not tried anything else.  She reports her acne is on her face and upper chest.  She reports washing her face nightly.  She reports her last period was abnormal and she had some irregular spotting.  She is requesting a pregnancy test today.  PERTINENT  PMH / PSH: Reviewed and updated   OBJECTIVE:   BP 107/76   Pulse 91   Ht 5\' 7"  (1.702 m)   Wt 103 lb (46.7 kg)   LMP 06/09/2023   SpO2 95%   BMI 16.13 kg/m   Well-appearing, no acute distress Cardio: Regular rate, regular rhythm, no murmurs on exam. Pulm: Clear, no wheezing, no crackles. No increased work of breathing Abdominal: bowel sounds present, soft, non-tender, non-distended  Skin: Mild cystic acne on the face     06/18/2023    3:45 PM 05/28/2023    3:15 PM 12/15/2021    3:15 PM  PHQ9 SCORE ONLY  PHQ-9 Total Score 4 5 10       ASSESSMENT/PLAN:   Acne Discussed using salicylic acid and benzyl peroxide twice a day for 1 month.  Patient to follow-up if she does not have any improvement.  Trying to get pregnant Pregnancy test negative.  Encourage patient to continue taking birth control.     Glendale Chard, DO [facility]

## 2023-08-15 NOTE — Assessment & Plan Note (Signed)
 Discussed using salicylic acid and benzyl peroxide twice a day for 1 month.  Patient to follow-up if she does not have any improvement.

## 2023-08-15 NOTE — Patient Instructions (Addendum)
     Try using salicylic acid and benzyl peroxide together twice a day.  This can cause drying of the skin.  Please use this for 1 month.

## 2023-08-15 NOTE — Assessment & Plan Note (Signed)
 Pregnancy test negative.  Encourage patient to continue taking birth control.

## 2023-08-20 ENCOUNTER — Other Ambulatory Visit (INDEPENDENT_AMBULATORY_CARE_PROVIDER_SITE_OTHER)

## 2023-08-20 DIAGNOSIS — Z7901 Long term (current) use of anticoagulants: Secondary | ICD-10-CM

## 2023-08-20 DIAGNOSIS — Z952 Presence of prosthetic heart valve: Secondary | ICD-10-CM | POA: Diagnosis not present

## 2023-08-20 LAB — POCT INR: INR: 1.9 — AB (ref 2.0–3.0)

## 2023-08-21 ENCOUNTER — Ambulatory Visit: Payer: 59 | Attending: Obstetrics and Gynecology

## 2023-08-21 ENCOUNTER — Ambulatory Visit: Payer: 59 | Admitting: Obstetrics and Gynecology

## 2023-08-21 DIAGNOSIS — Z3169 Encounter for other general counseling and advice on procreation: Secondary | ICD-10-CM

## 2023-08-21 DIAGNOSIS — Z952 Presence of prosthetic heart valve: Secondary | ICD-10-CM | POA: Diagnosis not present

## 2023-08-22 NOTE — Progress Notes (Signed)
 Maternal-Fetal Medicine Consultation I had the pleasure of seeing [name]. She is a 23 year old Go P0 and is here for preconception consultation. She has a mechanical heart valve.  At age 66, mitral valve prolapse was diagnosed. She had minimal or no symptoms. In October 2024, she had cardiology consultation with Dr. Servando Salina to discuss her desire to have a Salazar. On transesophageal Echocardiography, she had severe mitral valve regurgitation and the patient was advised to consult a cardiac surgeon for repair. She had no evidence of pulmonary hypertension.  Subsequently, she had consultation with a Cardiothoracic surgeon, Dr. Eugenio Hoes (04/02/23), who advised regular follow-up with her cardiologist. Patient later had a consultation with a cardiothoracic surgeon, Dr. Zebedee Iba, at [facility] On 05/21/23, the patient had valve repair and mechanical valve was inserted (St. Jude mechanical valve). Patient had normal postoperative recovery.  She is asymptomatic and does not have shortness of breath or chest pain.  She does not have hypertension or diabetes or any other chronic medical conditions Past surgical history: Mitral valve replacement surgery. Allergies: No known drug allergies. Social history: Denies tobacco or drug or alcohol use.  She has been living with a boyfriend for 10 years.  He is African-American and is in good health. Family history: Mother died of complications of diabetes mellitus. GYN history no history of abnormal Pap smears or breast disease.  Patient takes norethindrone daily (progesterone only contraception).  Our concerns include: Mechanical Heart Valve in Pregnancy  Mechanical Heart Valve (MHV) is associated with (when compared with patients with no valves or with tissue valves)  Increased maternal mortality that is directly related to mechanical valve thrombosis (MVT). Although it is increased, the incidence is very  low (about 1 to 2%). Lower live-birth rates (increased late fetal deaths) Increased thrombotic complications (MTV) Increased hemorrhagic complications. Life-long anticoagulation is necessary. In pregnancy, we recommend therapeutic anticoagulation.  -Mechanical Heart Valve in pregnancy poses challenges in pregnancy management. Pregnant women should be aware of increased risk of thrombotic and hemorrhagic complications associated with MHV.  -Studies in pregnant women with MHV are few and the guidelines are not uniform.  However, the guidelines seem to agree on the choice of anticoagulant to be used in pregnancy.  -Vitamin K antagonists (VKA) or warfarin is the drug of choice and is not commonly used in pregnancy for anticoagulation for other indications. MHV is an exception to this rule. Warfarin has a lower incidence of thrombosis and maternal complications as opposed to low-molecular weight heparin.  My counseling and recommendations are based on the Registry of Pregnancy and Cardiac Disease [facility]), European Society of Cardiology [facility]) 2018 Guidelines, and American Heart Association Scientific Statement (care of pregnant patients), Society of Maternal-Fetal Medicine. All references are given below.  Following complications are increased in pregnant patients with MHV (ROPAC study): -Maternal mortality (1.4%). This is not statistically significant from women having tissue heart valve (THV), but significantly higher than those without a prosthetic valve (0.2%). Two of 3 patients who died had thrombotic complications from MHV.  -Thrombosis complicated 4.7% (10 patients out of 212). In 5 patients, thrombosis occurred in the first trimester and all these patients switched to heparin form VKA. Thrombosis was associated with 20% mortality.  -Hemorrhagic complications were present in 23.1% of patients with MHV (5.1% with bioprosthetic valve and 4.9% without a prosthetic valve), which is significantly  higher. Overall, about 42% of patients with MHV had a serious adverse event (maternal mortality, fetal  loss, thrombotic events, major hemorrhagic events, supraventricular or ventricular arrhythmia requiring treatment, heart failure, endocarditis, acute coronary syndrome, aortic dissection, preeclampsia or HELLP syndrome).  Choice of anticoagulant Patient is faced with the option of continuing warfarin throughout pregnancy or take low-molecular weight heparin (LMWH). She also has an option of taking LMWH in the first trimester and switch to warfarin in the second trimester.  Mitral valves are associated with a greater likelihood of thrombosis than aortic valves.   First trimester Warfarin use in the first trimester associated with warfarin embryopathy and it includes nasal hypoplasia, chondrodysplasia punctate, cardiac anomalies, microcephaly, optic atrophy, blindness, deafness and central nervous system anomalies.  Small studies have shown that embryopathy is mainly seen with warfarin doses above 5 mg/day.  Miscarriage rates are not increased with warfarin use.  Our patient is taking warfarin 5 mg daily, and INR is being monitored by family physicians.  All societies mentioned above recommend warfarin anticoagulation in the first trimester if the patient is taking 5 mg or less daily. Target INR should be 3.0 in patients with mitral valve replacement.  Low-molecular weight heparin (LMWH) is associated with a higher incidence of thrombosis (up to 15%). Most cases of thrombosis followed inadequate anticoagulation or noncompliance. Careful adjustment of anti-Xa levels drawn 4 hours after injection (should be assessed every week or more often) is essential. The peak anti-Xa levels should be between 1.0 to 1.2 U/mL (mitral valve) and the trough levels (drawn before the dosage) should be greater than 0.7 U/mL.  If patient chooses LMWH in the first trimester and warfarin in the rest of her pregnancy,  transition should be undertaken as an inpatient. Transition increases the risk of thrombosis and other complications.  Second-and third-trimester Warfarin use in the second and third trimesters is associated with a slight increase in intracranial hemorrhage (2%), stillbirth (4%) and neonatal death (2%). LMWH is associated with increased maternal complications as mentioned above. Societies recommend continuing warfarin in the second and third trimesters (between 70- and 36-weeks' gestation.  Preparation for delivery For patients on warfarin, transition to LMWH is recommended at 35 to 36 weeks' gestation. Delivery is planned at 37 to 38 weeks' gestation.  Intravenous unfractionated heparin (UFH) should be started 36 to 48 hours before planned delivery and LMWH should be discontinued.  Mode of delivery Vaginal delivery is not contraindicated.  If patient goes into spontaneous preterm labor when on warfarin, cesarean section should be performed.  Aspirin Recommendations on low-dose aspirin is not consistent.  [facility] recommends low-dose aspirin with heparin anticoagulation.  Hemorrhagic complication is slightly increased with aspirin. We can readdress aspirin around [redacted] weeks gestation.  I summarized my counseling on the following important points: -Anticoagulation with warfarin is recommended in all trimesters of pregnancy. -Warfarin is associated with a slight increase in congenital malformations but less-likely with doses <5 mg/d. Warfarin in the second- and third-trimester is associated with a small increase in intracranial hemorrhage, stillbirth and neonatal death. -Low molecular weight heparin use in pregnancy is associated with increased likelihood of maternal complications including thrombosis (up to 15%). Thrombosis is associated with a higher chance of maternal death. -Transition to LMWH from warfarin can lead to maternal complications and should be done only in  in-patient setting. Patient was counseled with the flow chart on anticoagulation ([facility] article) and she was given a copy of the article. At the end of consultation, she informed that she prefers warfarin anticoagulation throughout her pregnancy because of increased maternal risks.  Recommendations -Warfarin anticoagulation to continue and keep INR levels that 3.0. -If patient wants to switch to LMWH, transition should be made as an inpatient after detection of fetal cardiac activity. -Goal is to keep the peak anti-Xa level at 1.0 to 1.2 U/mL (peak) and >0.7 IU/mL (trough). -Peak levels are drawn 4 hours after dose and trough levels before dosing. -Continue warfarin in the second and third trimesters unless patient prefers LMWH. -Transition to LMWH at 35 to [redacted] weeks gestation. -Plan delivery at 37 to [redacted] weeks gestation. -Discontinue LMWH and initiate IV unfractionated heparin UFH) 36 to 48 hours before planned delivery. -Discontinue UFH at 6 cm cervical dilation. -Discontinue IV UFH 4 to 6 hours if cesarean section is planned. -If patient is on warfarin anticoagulation and goes into preterm labor, cesarean section should be performed. -Remove epidural catheter 4 to 6 hours after stopping IV UFH and with normal aPTT. -Resume therapeutic dose of IV UFH 4 to 6 hours after delivery or 1 hour after removal of epidural spinal catheter. -Transition to warfarin. -Endocarditis prophylaxis to be given.  References Pregnancy in Women With a Mechanical Heart Valve. Data of the European Society of Cardiology Registry of Pregnancy and Cardiac Disease [facility]). Circulation Vol 132, Issue 2, 16 December 2013, pp. 132-142. Cardiovascular Considerations in Caring for Pregnant Patients. AHA Scientific Statement Regulatory affairs officer). Circulation. 2020;141:e884-e903. 2018 ESC Guidelines for the management of cardiovascular diseases during pregnancy. European Heart Journal (2018) 39, N7856265. Society of  Maternal-Fetal Medicine Consult Series#61. Anticoagulation in pregnant patients with cardiac disease. August 2022.  Consultation including face-to-face (more than 50%) counseling 60 minutes.

## 2023-08-24 ENCOUNTER — Telehealth: Payer: Self-pay | Admitting: Pharmacist

## 2023-08-24 ENCOUNTER — Ambulatory Visit: Admitting: Student

## 2023-08-24 DIAGNOSIS — Z952 Presence of prosthetic heart valve: Secondary | ICD-10-CM

## 2023-08-24 DIAGNOSIS — Z7901 Long term (current) use of anticoagulants: Secondary | ICD-10-CM

## 2023-08-24 NOTE — Telephone Encounter (Signed)
-----   Message from Nurse Devoria Glassing sent at 08/24/2023  1:28 PM EDT ----- Anita Salazar,  This is that needs to be seen in our Coumadin clinic per Der. Tobb.

## 2023-08-27 ENCOUNTER — Ambulatory Visit: Admitting: Student

## 2023-08-27 ENCOUNTER — Other Ambulatory Visit: Payer: Self-pay | Admitting: Family Medicine

## 2023-08-27 ENCOUNTER — Other Ambulatory Visit: Payer: Self-pay

## 2023-08-27 MED ORDER — WARFARIN SODIUM 2.5 MG PO TABS
2.5000 mg | ORAL_TABLET | Freq: Every day | ORAL | 11 refills | Status: DC
  Filled 2023-08-27: qty 30, 30d supply, fill #0

## 2023-08-28 ENCOUNTER — Other Ambulatory Visit (HOSPITAL_COMMUNITY): Payer: Self-pay

## 2023-08-28 ENCOUNTER — Other Ambulatory Visit: Payer: Self-pay | Admitting: Student

## 2023-08-28 DIAGNOSIS — Z952 Presence of prosthetic heart valve: Secondary | ICD-10-CM

## 2023-08-28 MED ORDER — WARFARIN SODIUM 2.5 MG PO TABS
2.5000 mg | ORAL_TABLET | Freq: Every day | ORAL | 11 refills | Status: DC
  Filled 2023-08-28: qty 30, 15d supply, fill #0

## 2023-08-28 NOTE — Telephone Encounter (Signed)
Called patient    Left message on machine

## 2023-08-29 NOTE — Telephone Encounter (Addendum)
 Spoke with pt and she states she was unaware that she was going to be managed by Cardiology. She states she will do what she needs to and she set appt with our clinic. Pt states she has been on warfarin since 05/21/23. Also, noted she has another appt on 3/31 with PCP to have INR checked  she states she will cancel it and let them know she will be managed with [place].   INR range 2.5-3.5 per Dr. Mallory Shirk note on 06/29/23 Warfarin 2.5mg  tablet taking 2 to 2.5 tabs daily

## 2023-08-30 ENCOUNTER — Telehealth: Payer: Self-pay

## 2023-08-30 NOTE — Telephone Encounter (Signed)
 Pt aunt is calling to ask questions about the patients INR. Pt has an appointment on 03/31/ to have her INR checked. Aunt asked several questions and I told her the patient really needs to meet with her Dr as I am not qualified to answer those questions. I told the Aunt that Dr Hyacinth Meeker has appt's available on Tuesday the 1st. She will speak with pt about changing the appointment so the patient can meet with the Dr to get their questions answered. They don't understand what happens with the heart when the blood is to thin or to thick. They also have a lot of diet questions. I told the Aunt that these questions need to be discussed in an appointment setting with the Dr, patient and her if possible. The aunt or patient will call if they can come in Tuesday the 1st. Anita Salazar

## 2023-09-03 ENCOUNTER — Other Ambulatory Visit

## 2023-09-04 ENCOUNTER — Other Ambulatory Visit: Payer: Self-pay

## 2023-09-04 ENCOUNTER — Ambulatory Visit: Attending: Cardiology | Admitting: *Deleted

## 2023-09-04 ENCOUNTER — Other Ambulatory Visit: Payer: Self-pay | Admitting: *Deleted

## 2023-09-04 ENCOUNTER — Other Ambulatory Visit (HOSPITAL_COMMUNITY): Payer: Self-pay

## 2023-09-04 DIAGNOSIS — Z952 Presence of prosthetic heart valve: Secondary | ICD-10-CM

## 2023-09-04 DIAGNOSIS — Z5181 Encounter for therapeutic drug level monitoring: Secondary | ICD-10-CM | POA: Diagnosis not present

## 2023-09-04 LAB — POCT INR: INR: 2.5 (ref 2.0–3.0)

## 2023-09-04 MED ORDER — WARFARIN SODIUM 2.5 MG PO TABS
ORAL_TABLET | ORAL | 0 refills | Status: DC
  Filled 2023-09-04: qty 70, fill #0
  Filled 2023-09-10 – 2023-09-11 (×2): qty 70, 30d supply, fill #0

## 2023-09-04 NOTE — Telephone Encounter (Signed)
 Warfarin 2.5mg  tablet refill mitral valve replacement with mechanical valve Last INR today-new pt transfer from pcp Last OV 06/29/23

## 2023-09-04 NOTE — Patient Instructions (Addendum)
 A full discussion of the nature of anticoagulants has been carried out.  A benefit risk analysis has been presented to the patient, so that they understand the justification for choosing anticoagulation at this time. The need for frequent and regular monitoring, precise dosage adjustment and compliance is stressed.  Side effects of potential bleeding are discussed.  The patient should avoid any OTC items containing aspirin or ibuprofen, and should avoid great swings in general diet.  Avoid alcohol consumption.  Call if any signs of abnormal bleeding.   Description   Start taking warfarin 2.5 tablets daily except 2 tablets on Sunday, Tuesday, and Thursday. Recheck INR in 1 week at [facility] location. Anticoagulation Clinic [phone]

## 2023-09-07 ENCOUNTER — Encounter: Payer: Self-pay | Admitting: Cardiology

## 2023-09-07 DIAGNOSIS — Z3169 Encounter for other general counseling and advice on procreation: Secondary | ICD-10-CM

## 2023-09-11 ENCOUNTER — Other Ambulatory Visit (HOSPITAL_COMMUNITY): Payer: Self-pay

## 2023-09-11 ENCOUNTER — Ambulatory Visit

## 2023-09-12 ENCOUNTER — Ambulatory Visit: Attending: Cardiology | Admitting: *Deleted

## 2023-09-12 ENCOUNTER — Other Ambulatory Visit (HOSPITAL_COMMUNITY): Payer: Self-pay

## 2023-09-12 DIAGNOSIS — Z952 Presence of prosthetic heart valve: Secondary | ICD-10-CM | POA: Diagnosis not present

## 2023-09-12 DIAGNOSIS — Z5181 Encounter for therapeutic drug level monitoring: Secondary | ICD-10-CM | POA: Diagnosis not present

## 2023-09-12 DIAGNOSIS — Z7901 Long term (current) use of anticoagulants: Secondary | ICD-10-CM | POA: Diagnosis not present

## 2023-09-12 LAB — POCT INR: INR: 2.1 (ref 2.0–3.0)

## 2023-09-12 NOTE — Patient Instructions (Signed)
 Description   Today take 3 tablets of warfarin then start taking warfarin 2.5 tablets daily. Continue eating 1 leafy vegetable weekly. Recheck INR in 1 week at [facility] location. Anticoagulation Clinic [phone]

## 2023-09-18 ENCOUNTER — Ambulatory Visit: Attending: Cardiology | Admitting: *Deleted

## 2023-09-18 DIAGNOSIS — Z952 Presence of prosthetic heart valve: Secondary | ICD-10-CM

## 2023-09-18 DIAGNOSIS — Z7901 Long term (current) use of anticoagulants: Secondary | ICD-10-CM

## 2023-09-18 DIAGNOSIS — Z5181 Encounter for therapeutic drug level monitoring: Secondary | ICD-10-CM

## 2023-09-18 LAB — POCT INR: INR: 2.3 (ref 2.0–3.0)

## 2023-09-18 NOTE — Patient Instructions (Addendum)
 Description   Today take 3 tablets of warfarin then start taking warfarin 2.5 tablets daily except 3 tablets on Tuesday and Saturdays. Continue eating 1 leafy vegetable weekly. Recheck INR in 1 week at [facility] location. Anticoagulation Clinic [phone]

## 2023-09-25 ENCOUNTER — Telehealth: Payer: Self-pay | Admitting: *Deleted

## 2023-09-25 ENCOUNTER — Ambulatory Visit (HOSPITAL_BASED_OUTPATIENT_CLINIC_OR_DEPARTMENT_OTHER): Admitting: Certified Nurse Midwife

## 2023-09-25 ENCOUNTER — Ambulatory Visit

## 2023-09-25 NOTE — Telephone Encounter (Signed)
 Patient called to report that she has overslept and missed her 10am appointment. She states it takes her 13 minutes to get here and advised to come on and will work her in.

## 2023-10-05 ENCOUNTER — Other Ambulatory Visit (HOSPITAL_COMMUNITY): Payer: Self-pay

## 2023-10-05 ENCOUNTER — Other Ambulatory Visit: Payer: Self-pay | Admitting: Cardiology

## 2023-10-05 DIAGNOSIS — Z952 Presence of prosthetic heart valve: Secondary | ICD-10-CM

## 2023-10-05 MED ORDER — WARFARIN SODIUM 2.5 MG PO TABS
6.2500 mg | ORAL_TABLET | Freq: Every day | ORAL | 0 refills | Status: DC
  Filled 2023-10-05: qty 80, 30d supply, fill #0
  Filled 2023-10-08: qty 80, 26d supply, fill #0

## 2023-10-05 NOTE — Telephone Encounter (Signed)
 Prescription refill request received for warfarin Lov: 06/29/23 (Tobb)  Next INR check: 09/25/23(Tobb)  Warfarin tablet strength: 2.5mg   Appropriate dose. Refill sent.

## 2023-10-08 ENCOUNTER — Other Ambulatory Visit (HOSPITAL_COMMUNITY): Payer: Self-pay

## 2023-10-08 ENCOUNTER — Ambulatory Visit

## 2023-10-10 ENCOUNTER — Ambulatory Visit (HOSPITAL_COMMUNITY): Payer: 59 | Attending: Cardiology

## 2023-10-10 DIAGNOSIS — Z952 Presence of prosthetic heart valve: Secondary | ICD-10-CM

## 2023-10-10 DIAGNOSIS — I361 Nonrheumatic tricuspid (valve) insufficiency: Secondary | ICD-10-CM | POA: Diagnosis not present

## 2023-10-10 LAB — ECHOCARDIOGRAM COMPLETE
Area-P 1/2: 3.77 cm2
MV VTI: 1.44 cm2
S' Lateral: 2.75 cm

## 2023-10-12 ENCOUNTER — Ambulatory Visit: Payer: 59 | Admitting: Cardiology

## 2023-10-12 ENCOUNTER — Ambulatory Visit: Attending: Cardiology

## 2023-10-12 DIAGNOSIS — Z952 Presence of prosthetic heart valve: Secondary | ICD-10-CM

## 2023-10-12 DIAGNOSIS — Z7901 Long term (current) use of anticoagulants: Secondary | ICD-10-CM

## 2023-10-12 DIAGNOSIS — Z5181 Encounter for therapeutic drug level monitoring: Secondary | ICD-10-CM | POA: Diagnosis not present

## 2023-10-12 LAB — POCT INR: INR: 3.1 — AB (ref 2.0–3.0)

## 2023-10-12 NOTE — Patient Instructions (Signed)
 Continue taking warfarin 2.5 tablets daily except 3 tablets on Tuesdays and Saturdays. Continue eating 1 leafy vegetable weekly. Recheck INR in 2 weeks.  Anticoagulation Clinic [phone]

## 2023-10-15 ENCOUNTER — Telehealth: Payer: Self-pay | Admitting: Cardiology

## 2023-10-15 NOTE — Telephone Encounter (Signed)
Pt is calling to get her echo results.

## 2023-10-15 NOTE — Telephone Encounter (Signed)
 Spoke with pt and her aunt regarding her echo results. Pt was informed that the results of her echo have not been resulted by the doctor yet and what they are reading on MyChart currently is for the doctor to read. Pt's aunt was upset that the results came to them on MyChart prior to the doctor resulting them. Pt's aunt stated it is causing the pt stress that is bad for her heart. Pt and aunt were apologized to and told that we will contact them with the echo results when they are available. Pt and aunt verbalized understanding. All questions if any were answered.

## 2023-10-16 ENCOUNTER — Ambulatory Visit: Payer: Self-pay | Admitting: Cardiology

## 2023-10-17 NOTE — Telephone Encounter (Signed)
  Pt's Aunt Stana Ear calling back to follow up echo results

## 2023-10-17 NOTE — Telephone Encounter (Signed)
 Left message for linda, aware dr tobb is not in the office this week but will forward to her.

## 2023-10-20 ENCOUNTER — Other Ambulatory Visit: Payer: Self-pay | Admitting: Cardiology

## 2023-10-20 DIAGNOSIS — Z952 Presence of prosthetic heart valve: Secondary | ICD-10-CM

## 2023-10-22 ENCOUNTER — Other Ambulatory Visit: Payer: Self-pay

## 2023-10-22 ENCOUNTER — Other Ambulatory Visit (HOSPITAL_COMMUNITY): Payer: Self-pay

## 2023-10-22 MED ORDER — WARFARIN SODIUM 2.5 MG PO TABS
6.2500 mg | ORAL_TABLET | Freq: Every day | ORAL | 0 refills | Status: DC
  Filled 2023-10-22 – 2023-11-05 (×2): qty 80, 26d supply, fill #0

## 2023-10-23 ENCOUNTER — Ambulatory Visit: Attending: Cardiology | Admitting: Cardiology

## 2023-10-23 ENCOUNTER — Encounter: Payer: Self-pay | Admitting: Cardiology

## 2023-10-23 ENCOUNTER — Ambulatory Visit (INDEPENDENT_AMBULATORY_CARE_PROVIDER_SITE_OTHER): Admitting: *Deleted

## 2023-10-23 VITALS — BP 127/78 | HR 86 | Ht 66.0 in | Wt 101.2 lb

## 2023-10-23 DIAGNOSIS — I361 Nonrheumatic tricuspid (valve) insufficiency: Secondary | ICD-10-CM

## 2023-10-23 DIAGNOSIS — Z5181 Encounter for therapeutic drug level monitoring: Secondary | ICD-10-CM

## 2023-10-23 DIAGNOSIS — Z7901 Long term (current) use of anticoagulants: Secondary | ICD-10-CM

## 2023-10-23 DIAGNOSIS — Z3169 Encounter for other general counseling and advice on procreation: Secondary | ICD-10-CM | POA: Diagnosis not present

## 2023-10-23 DIAGNOSIS — Z952 Presence of prosthetic heart valve: Secondary | ICD-10-CM

## 2023-10-23 LAB — POCT INR: INR: 2.7 (ref 2.0–3.0)

## 2023-10-23 NOTE — Patient Instructions (Signed)
 Medication Instructions:  Your physician recommends that you continue on your current medications as directed. Please refer to the Current Medication list given to you today.  *If you need a refill on your cardiac medications before your next appointment, please call your pharmacy*  Follow-Up: At [facility], you and your health needs are our priority.  As part of our continuing mission to provide you with exceptional heart care, our providers are all part of one team.  This team includes your primary Cardiologist (physician) and Advanced Practice Providers or APPs (Physician Assistants and Nurse Practitioners) who all work together to provide you with the care you need, when you need it.  Your next appointment:   12 week(s)  Provider:   Kardie Tobb, DO

## 2023-10-23 NOTE — Patient Instructions (Signed)
 Description   Continue taking warfarin 2.5 tablets daily except 3 tablets on Tuesdays and Saturdays. Continue eating 1 leafy vegetable weekly. Recheck INR in 4 weeks.  Anticoagulation Clinic [phone]

## 2023-10-23 NOTE — Progress Notes (Signed)
 Cardio-Obstetrics Clinic  Follow Up Note   Date:  10/27/2023   ID:  Anita Salazar, DOB 10-09-2000, MRN [id number]  PCP:  Clem Currier, DO   [facility] Providers Cardiologist:  Jerryl Morin, DO  Electrophysiologist:  None        Referring MD: Clem Currier, DO   Chief Complaint: " I am doing well"  History of Present Illness:    Anita Salazar is a 23 y.o. female [No obstetric history on file.] who returns for follow up.  Medical history includes mitral valve replacement with mechanical valve,   Her visit with me was 03/2023 at that time I advised the patient not to conceive until she has her severe mitral regurgitation address due severe primary mitral regurgitation. She was initially refer to CT surgery at [facility] who recommended waiting and monitoring and reported high likelihood for mitral valve repair. She then started to experience shortness of breath,  and fatigue and with her desire to conceive and her valve disease making her high risk for potential complication - I send the patient to [facility] for a second opinion  on her request and  she was deemed appropriate to proceed with a MVR.  She underwent surgery in December 2024.   At her last visit with me we ordered a TTE  postsurgery.  At that time she still desired to be pregnant. I advised her that this will be a very high risk undertaking.   In the interim I ordered her echo . She was was transitioned to our [facility] coumadin  clinic.  also has seen MFM. During her visit with MFM she was advised if she conceives this will be a very high risk pregnancy.   She is her today with her significant other/boyfriend. Her Anita Salazar was on the phone during this visit.   Today She offers no complaints. She has questions about her recent echo and also timing of conception.   Prior CV Studies Reviewed: The following studies were reviewed today: Echo on 10/2023 IMPRESSIONS     1. Left ventricular  ejection fraction, by estimation, is 50 to 55%. The  left ventricle has low normal function. The left ventricle has no regional  wall motion abnormalities. Left ventricular diastolic parameters are  indeterminate. The average left  ventricular global longitudinal strain is -21.1 %. The global longitudinal  strain is normal.   2. Right ventricular systolic function is normal. The right ventricular  size is normal.   3. The mitral valve has been repaired/replaced. No evidence of mitral valve regurgitation. No evidence of mitral stenosis. The mean mitral valve gradient is 3.5 mmHg with average heart rate of 85 bpm.   4. Tricuspid valve regurgitation is moderate to severe.   5. The aortic valve is tricuspid. Aortic valve regurgitation is not  visualized. No aortic stenosis is present.   Comparison(s): Prior images reviewed side by side. S/p MVR. Tricuspid  valve regurgitation is prominent.   FINDINGS   Left Ventricle: No 3D transmitted. Left ventricular ejection fraction, by  estimation, is 50 to 55%. The left ventricle has low normal function. The  left ventricle has no regional wall motion abnormalities. The average left  ventricular global  longitudinal strain is -21.1 %. Strain was performed and the global  longitudinal strain is normal. The left ventricular internal cavity size  was normal in size. Suboptimal image quality limits for assessment of left  ventricular hypertrophy. Abnormal  (paradoxical) septal motion consistent with post-operative status. Left  ventricular diastolic parameters are indeterminate.   Right Ventricle: The right ventricular size is normal. No increase in  right ventricular wall thickness. Right ventricular systolic function is  normal.   Left Atrium: Left atrial size was normal in size.   Right Atrium: Right atrial size was normal in size.   Pericardium: There is no evidence of pericardial effusion.   Mitral Valve: The mitral valve has been  repaired/replaced. No evidence of  mitral valve regurgitation. There is a 27 mm [employer] Jude mechanical valve  present in the mitral position. No evidence of mitral valve stenosis. MV  peak gradient, 5.1 mmHg. The mean  mitral valve gradient is 3.5 mmHg with average heart rate of 85 bpm.   Tricuspid Valve: The tricuspid valve is normal in structure. Tricuspid  valve regurgitation is moderate to severe. No evidence of tricuspid  stenosis.   Aortic Valve: The aortic valve is tricuspid. Aortic valve regurgitation is  not visualized. No aortic stenosis is present.   Pulmonic Valve: The pulmonic valve was normal in structure. Pulmonic valve  regurgitation is not visualized. No evidence of pulmonic stenosis.   Aorta: The aortic root and ascending aorta are structurally normal, with  no evidence of dilitation.   IAS/Shunts: The atrial septum is grossly normal.    Past Medical History:  Diagnosis Date   Mitral regurgitation    MVP (mitral valve prolapse)     Past Surgical History:  Procedure Laterality Date   None     TEE WITHOUT CARDIOVERSION N/A 03/13/2023   Procedure: TRANSESOPHAGEAL ECHOCARDIOGRAM;  Surgeon: Sheryle Donning, MD;  Location: [facility] INVASIVE CV LAB;  Service: Cardiovascular;  Laterality: N/A;      OB History   No obstetric history on file.         Current Medications: Current Meds  Medication Sig   acetaminophen  (TYLENOL ) 500 MG tablet Take 500-1,000 mg by mouth every 6 (six) hours as needed.   aspirin 81 MG chewable tablet Chew 81 mg by mouth daily.   fexofenadine  (ALLEGRA  ALLERGY) 180 MG tablet Take 1 tablet (180 mg total) by mouth daily.   ibuprofen  (ADVIL ) 200 MG tablet Take 200 mg by mouth every 4 (four) hours as needed for headache.   metoprolol  tartrate (LOPRESSOR ) 25 MG tablet Take 1 tablet (25 mg total) by mouth 2 (two) times daily.   metoprolol  tartrate (LOPRESSOR ) 25 MG tablet Take 1 tablet (25 mg total) by mouth 2 (two) times daily.    norethindrone (MICRONOR) 0.35 MG tablet Take 1 tablet by mouth daily.   warfarin (COUMADIN ) 2.5 MG tablet Take 2&1/2 to 3 tablets (6.25-7.5 mg total) by mouth daily or as directed by Anticoagulation Clinic.     Allergies:   Patient has no known allergies.   Social History   Socioeconomic History   Marital status: Single    Spouse name: Not on file   Number of children: Not on file   Years of education: Not on file   Highest education level: Not on file  Occupational History   Not on file  Tobacco Use   Smoking status: Never   Smokeless tobacco: Never  Vaping Use   Vaping status: Former  Substance and Sexual Activity   Alcohol use: Yes    Alcohol/week: 2.0 standard drinks of alcohol    Types: 2 Glasses of wine per week   Drug use: Yes    Types: Marijuana    Comment: 'here and there', 'i hit it a couple times last night'  Sexual activity: Yes    Birth control/protection: None  Other Topics Concern   Not on file  Social History Narrative   Lives with boyfriend.    Social Drivers of Corporate investment banker Strain: Not on file  Food Insecurity: Not on file  Transportation Needs: No Transportation Needs (05/21/2023)   Received from [facility] - Transportation    In the past 12 months, has lack of transportation kept you from medical appointments or from getting medications?: No    Lack of Transportation (Non-Medical): No  Physical Activity: Not on file  Stress: Not on file  Social Connections: Not on file      Family History  Problem Relation Age of Onset   Diabetes Mother    Heart disease Father        No details      ROS:   Please see the history of present illness.    none All other systems reviewed and are negative.   Labs/EKG Reviewed:    EKG:  None today  Recent Labs: 03/07/2023: BUN 9; Creatinine, Ser 0.83; Magnesium 2.2; Potassium 4.6; Sodium 140 08/08/2023: Hemoglobin 12.4; Platelets 414   Recent Lipid Panel No  results found for: "CHOL", "TRIG", "HDL", "CHOLHDL", "LDLCALC", "LDLDIRECT"  Physical Exam:    VS:  BP 127/78 (BP Location: Right Arm, Patient Position: Sitting, Cuff Size: Normal)   Pulse 86   Ht 5\' 6"  (1.676 m)   Wt 101 lb 3.2 oz (45.9 kg)   SpO2 100%   BMI 16.33 kg/m     Wt Readings from Last 3 Encounters:  10/23/23 101 lb 3.2 oz (45.9 kg)  08/15/23 103 lb (46.7 kg)  08/08/23 103 lb 12.8 oz (47.1 kg)     GEN:  Well nourished, well developed in no acute distress HEENT: Normal NECK: No JVD; No carotid bruits LYMPHATICS: No lymphadenopathy CARDIAC: RRR, mechanical click, noted holosystolic murmurs, rubs, gallops RESPIRATORY:  Clear to auscultation without rales, wheezing or rhonchi  ABDOMEN: Soft, non-tender, non-distended MUSCULOSKELETAL:  No edema; No deformity  SKIN: Warm and dry NEUROLOGIC:  Alert and oriented x 3 PSYCHIATRIC:  Normal affect    Risk Assessment/Risk Calculators:                 ASSESSMENT & PLAN:    Status post mechanical mitral valve due to primary severe mitral regurgitation Chronic anticoagulation  Preconception counseling  Tricuspid Regurgitation   She is currently on coumadin  6.25 mg and 7 mg on alternate day - which in the most recent labs she is at goal. She is aware of the dietary restrictions.  She will continue to follow with our coumadin  clinic.   We discussed her echo results which showed low normal EF, Moderate to severe tricuspid regurgitation. The mechanical valve is well seated. I explained to the patient and her aunt what it means to have moderate and severe tricuspid regurgitation.   Clinically she denies any symptoms of shortness of breath, no edema. I have educated the patient on sign of fluid overload.   Using the CARPREG score she is 41% for primary cardiac events making her a very high risk for maternal mortality.  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  In addition to her moderate to severe TR, although regurgitant lesion  is tolerated in pregnancy she will be at high risk for right heart failure.   She will need expert care with a cardioobstetrics multidisplinary team approach, close follow up - yet understanding that the undertaking of getting pregnant is a significant risk although it is not completely contraindicated.     With all of this discussed she still inquire when she can stop her birth control. The main problem here is her high dose of coumadin  for target. If she is insist and does get pregnant we will have to transition her to Low molecular heparin. If she can be maintained on coumadin  5 mg or less ideally 2.5 mg she can stay on her coumadin  in the first trimester.    She still desires to have a baby in the near future. I explained to the patient that this will be a high risk pregnancy with being on anticoagulation. We will discuss more at her follow up visit in 4 months   We will continue to follow her closely.   This was a very long visit over 40 minutes speaking with the patient and her Oral Billings ( her aunt on the phone, boyfriend in the room)    Patient Instructions  Medication Instructions:  Your physician recommends that you continue on your current medications as directed. Please refer to the Current Medication list given to you today.  *If you need a refill on your cardiac medications before your next appointment, please call your pharmacy*  Follow-Up: At [facility], you and your health needs are our priority.  As part of our continuing mission to provide you with exceptional heart care, our providers are all part of one team.  This team includes your primary Cardiologist (physician) and Advanced Practice Providers or APPs (Physician Assistants and Nurse Practitioners) who  all work together to provide you with the care you need, when you need it.  Your next appointment:   12 week(s)  Provider:   Ossie Yebra, DO     Dispo:  No follow-ups on file.   Medication Adjustments/Labs and Tests Ordered: Current medicines are reviewed at length with the patient today.  Concerns regarding medicines are outlined above.  Tests Ordered: No orders of the defined types were placed in this encounter.  Medication Changes: No orders of the defined types were placed in this encounter.

## 2023-11-01 ENCOUNTER — Ambulatory Visit (HOSPITAL_BASED_OUTPATIENT_CLINIC_OR_DEPARTMENT_OTHER): Admitting: Certified Nurse Midwife

## 2023-11-01 ENCOUNTER — Encounter (HOSPITAL_BASED_OUTPATIENT_CLINIC_OR_DEPARTMENT_OTHER): Payer: Self-pay | Admitting: Certified Nurse Midwife

## 2023-11-01 ENCOUNTER — Other Ambulatory Visit (HOSPITAL_COMMUNITY): Payer: Self-pay

## 2023-11-01 VITALS — BP 108/78 | HR 84 | Ht 67.0 in | Wt 101.8 lb

## 2023-11-01 DIAGNOSIS — Z952 Presence of prosthetic heart valve: Secondary | ICD-10-CM | POA: Diagnosis not present

## 2023-11-01 DIAGNOSIS — Z3169 Encounter for other general counseling and advice on procreation: Secondary | ICD-10-CM

## 2023-11-01 MED ORDER — PRENATAL PLUS 27-1 MG PO TABS: 1.0000 | ORAL_TABLET | Freq: Every day | ORAL | 50 refills | Status: AC

## 2023-11-01 NOTE — Progress Notes (Signed)
  GYNECOLOGY  VISIT  CC:   Here to talk about possible pregnancy  HPI: 23 y.o. G0P0000 Single Black or [place] American female here to establish care. Accompanied by her supportive boyfriend "Dulce Gibbs". Pt is currently on pills for contraception. Reports she is aware that pregnancy would be high-risk pregnancy. She strongly desires to carry her child and have a biological child. She would like to breastfeed. Pt aware that she will possibly require daily or twice daily Lovenox injections throughout the pregnancy and postpartum.  Her parents have both passed away. She was 16yo when her Mother passed. Her Father passed away. Pt states she has an Aunt who is supportive. She is doing well emotionally. Her [facility]    Past Medical History:  Diagnosis Date   Mitral regurgitation    MVP (mitral valve prolapse)     MEDS:   Current Outpatient Medications on File Prior to Visit  Medication Sig Dispense Refill   acetaminophen  (TYLENOL ) 500 MG tablet Take 500-1,000 mg by mouth every 6 (six) hours as needed.     aspirin 81 MG chewable tablet Chew 81 mg by mouth daily.     fexofenadine  (ALLEGRA  ALLERGY) 180 MG tablet Take 1 tablet (180 mg total) by mouth daily. 30 tablet 0   ibuprofen  (ADVIL ) 200 MG tablet Take 200 mg by mouth every 4 (four) hours as needed for headache.     metoprolol  tartrate (LOPRESSOR ) 25 MG tablet Take 1 tablet (25 mg total) by mouth 2 (two) times daily. 90 tablet 3   metoprolol  tartrate (LOPRESSOR ) 25 MG tablet Take 1 tablet (25 mg total) by mouth 2 (two) times daily. 90 tablet 3   norethindrone (MICRONOR) 0.35 MG tablet Take 1 tablet by mouth daily.     warfarin (COUMADIN ) 2.5 MG tablet Take 2&1/2 to 3 tablets (6.25-7.5 mg total) by mouth daily or as directed by Anticoagulation Clinic. 80 tablet 0   No current facility-administered medications on file prior to visit.    ALLERGIES: Patient has no known allergies.    PHYSICAL  EXAMINATION:    BP 108/78 (BP Location: Right Arm, Patient Position: Sitting, Cuff Size: Normal)   Pulse 84   Ht 5\' 7"  (1.702 m)   Wt 101 lb 12.8 oz (46.2 kg)   BMI 15.94 kg/m     General appearance: alert, cooperative and appears stated age  Assessment/Plan:  1. Encounter for preconception consultation (Primary) - Pt verbalizes understanding that she is aware of risks of pregnancy including but not limited to risk for DVT/blood clot/heart attack/stroke/PreEclampsia - Begin prenatal vitamin one po daily - Pt will RTO in 3 months with menstrual calendar. Plans to stop oral contraceptives within 1-2 weeks and start trying to conceive. - She is instructed to call with + urine pregnancy test. - Healthy diet encouraged  Yolanda Hence

## 2023-11-05 ENCOUNTER — Other Ambulatory Visit (HOSPITAL_COMMUNITY): Payer: Self-pay

## 2023-11-05 ENCOUNTER — Other Ambulatory Visit: Payer: Self-pay

## 2023-11-19 ENCOUNTER — Encounter (HOSPITAL_BASED_OUTPATIENT_CLINIC_OR_DEPARTMENT_OTHER): Payer: Self-pay | Admitting: Certified Nurse Midwife

## 2023-11-20 ENCOUNTER — Encounter (HOSPITAL_BASED_OUTPATIENT_CLINIC_OR_DEPARTMENT_OTHER): Payer: Self-pay | Admitting: Obstetrics & Gynecology

## 2023-11-20 ENCOUNTER — Ambulatory Visit

## 2023-11-22 ENCOUNTER — Ambulatory Visit: Attending: Cardiology | Admitting: *Deleted

## 2023-11-22 DIAGNOSIS — Z7901 Long term (current) use of anticoagulants: Secondary | ICD-10-CM

## 2023-11-22 DIAGNOSIS — Z5181 Encounter for therapeutic drug level monitoring: Secondary | ICD-10-CM

## 2023-11-22 DIAGNOSIS — Z952 Presence of prosthetic heart valve: Secondary | ICD-10-CM

## 2023-11-22 LAB — POCT INR: POC INR: 1.6

## 2023-11-22 NOTE — Patient Instructions (Signed)
 Description   Take 4 tablets of warfarin today and tomorrow Then Continue taking warfarin 2.5 tablets daily except 3 tablets on Tuesdays and Saturdays. Continue eating 1 leafy vegetable weekly. Recheck INR in 1.5 weeks.  Anticoagulation Clinic [phone]

## 2023-11-22 NOTE — Progress Notes (Signed)
Please see anticoagulation encounter.

## 2023-11-25 ENCOUNTER — Other Ambulatory Visit: Payer: Self-pay | Admitting: Cardiology

## 2023-11-25 DIAGNOSIS — Z952 Presence of prosthetic heart valve: Secondary | ICD-10-CM

## 2023-12-03 ENCOUNTER — Other Ambulatory Visit: Payer: Self-pay

## 2023-12-03 ENCOUNTER — Ambulatory Visit: Attending: Cardiology

## 2023-12-03 ENCOUNTER — Other Ambulatory Visit (HOSPITAL_COMMUNITY): Payer: Self-pay

## 2023-12-03 ENCOUNTER — Other Ambulatory Visit: Payer: Self-pay | Admitting: Cardiology

## 2023-12-03 DIAGNOSIS — Z952 Presence of prosthetic heart valve: Secondary | ICD-10-CM | POA: Diagnosis not present

## 2023-12-03 DIAGNOSIS — Z7901 Long term (current) use of anticoagulants: Secondary | ICD-10-CM

## 2023-12-03 DIAGNOSIS — Z5181 Encounter for therapeutic drug level monitoring: Secondary | ICD-10-CM | POA: Diagnosis not present

## 2023-12-03 LAB — POCT INR: INR: 1.7 — AB (ref 2.0–3.0)

## 2023-12-03 MED ORDER — WARFARIN SODIUM 2.5 MG PO TABS
6.2500 mg | ORAL_TABLET | Freq: Every day | ORAL | 0 refills | Status: DC
Start: 2023-12-03 — End: 2024-01-02
  Filled 2023-12-03: qty 80, 26d supply, fill #0

## 2023-12-03 NOTE — Progress Notes (Signed)
Please see anticoagulation encounter.

## 2023-12-03 NOTE — Patient Instructions (Signed)
 Take 3 tablets of warfarin today and 4 tablets tomorrow and Then Continue taking warfarin 2.5 tablets daily except 3 tablets on Tuesdays and Saturdays. Continue eating 1 leafy vegetable weekly. Recheck INR in 2 weeks.  Anticoagulation Clinic [phone]

## 2023-12-17 ENCOUNTER — Ambulatory Visit

## 2023-12-21 ENCOUNTER — Ambulatory Visit: Attending: Cardiology | Admitting: *Deleted

## 2023-12-21 DIAGNOSIS — Z7901 Long term (current) use of anticoagulants: Secondary | ICD-10-CM | POA: Diagnosis not present

## 2023-12-21 DIAGNOSIS — Z5181 Encounter for therapeutic drug level monitoring: Secondary | ICD-10-CM

## 2023-12-21 DIAGNOSIS — Z952 Presence of prosthetic heart valve: Secondary | ICD-10-CM | POA: Diagnosis not present

## 2023-12-21 LAB — POCT INR: INR: 2 (ref 2.0–3.0)

## 2023-12-21 NOTE — Progress Notes (Signed)
 INR 2.0 Please see anticoagulation encounter.

## 2023-12-21 NOTE — Patient Instructions (Signed)
 Description   Take 3 tablets of warfarin today start taking warfarin 2.5 tablets daily except 3 tablets on Tuesdays, Thursdays, and Saturdays. Continue eating 1 leafy vegetable weekly. Recheck INR in 2 weeks.  Anticoagulation Clinic [phone]

## 2023-12-31 ENCOUNTER — Other Ambulatory Visit (HOSPITAL_BASED_OUTPATIENT_CLINIC_OR_DEPARTMENT_OTHER): Payer: Self-pay | Admitting: Certified Nurse Midwife

## 2023-12-31 ENCOUNTER — Telehealth (HOSPITAL_BASED_OUTPATIENT_CLINIC_OR_DEPARTMENT_OTHER): Payer: Self-pay | Admitting: Certified Nurse Midwife

## 2023-12-31 MED ORDER — IBUPROFEN 800 MG PO TABS: 800.0000 mg | ORAL_TABLET | Freq: Three times a day (TID) | ORAL | 0 refills | Status: AC | PRN

## 2023-12-31 NOTE — Telephone Encounter (Signed)
 New message   The patient reports menstrual pain and has a history of MVP She is requesting something for the pain.   [facility] on [address] was noted.   The primary care physician was contacted, but there was no answer.

## 2024-01-02 ENCOUNTER — Other Ambulatory Visit: Payer: Self-pay | Admitting: *Deleted

## 2024-01-02 ENCOUNTER — Other Ambulatory Visit (HOSPITAL_COMMUNITY): Payer: Self-pay

## 2024-01-02 DIAGNOSIS — Z952 Presence of prosthetic heart valve: Secondary | ICD-10-CM

## 2024-01-02 MED ORDER — WARFARIN SODIUM 2.5 MG PO TABS
6.2500 mg | ORAL_TABLET | Freq: Every day | ORAL | 2 refills | Status: DC
  Filled 2024-01-02: qty 80, 30d supply, fill #0

## 2024-01-02 NOTE — Telephone Encounter (Signed)
 Warfarin 2.5mg  refill History of mitral valve replacement  Last INR 12/21/23 Last OV 10/23/23

## 2024-01-04 ENCOUNTER — Ambulatory Visit: Attending: Cardiology | Admitting: *Deleted

## 2024-01-04 ENCOUNTER — Other Ambulatory Visit (HOSPITAL_COMMUNITY): Payer: Self-pay

## 2024-01-04 DIAGNOSIS — Z952 Presence of prosthetic heart valve: Secondary | ICD-10-CM | POA: Diagnosis not present

## 2024-01-04 DIAGNOSIS — Z5181 Encounter for therapeutic drug level monitoring: Secondary | ICD-10-CM

## 2024-01-04 DIAGNOSIS — Z7901 Long term (current) use of anticoagulants: Secondary | ICD-10-CM

## 2024-01-04 LAB — POCT INR: INR: 2.1 (ref 2.0–3.0)

## 2024-01-04 NOTE — Progress Notes (Signed)
 INR 2.1 Please see anticoagulation encounter

## 2024-01-04 NOTE — Patient Instructions (Addendum)
 Description   Take 3 tablets of warfarin today start taking warfarin 3 tablets daily except 2 tablets on Monday, Wednesday, and Friday. Continue eating 1 leafy vegetable weekly. Recheck INR in 2 weeks.  Anticoagulation Clinic [phone]

## 2024-01-08 ENCOUNTER — Encounter (HOSPITAL_BASED_OUTPATIENT_CLINIC_OR_DEPARTMENT_OTHER): Payer: Self-pay | Admitting: Certified Nurse Midwife

## 2024-01-08 ENCOUNTER — Ambulatory Visit (HOSPITAL_BASED_OUTPATIENT_CLINIC_OR_DEPARTMENT_OTHER): Payer: Self-pay | Admitting: Certified Nurse Midwife

## 2024-01-08 VITALS — BP 136/87 | HR 87 | Ht 66.0 in | Wt 104.6 lb

## 2024-01-08 DIAGNOSIS — Z3169 Encounter for other general counseling and advice on procreation: Secondary | ICD-10-CM

## 2024-01-09 NOTE — Progress Notes (Signed)
 Subjective:     Anita Salazar is a 23 y.o. female here for follow-up from 11/01/23. Pt has discontinued her birth control pills and is now using app to track her periods. Reports she is aware that pregnancy would be high-risk pregnancy. She strongly desires to carry her child and have a biological child. She would like to breastfeed. Pt aware that she will possibly require daily or twice daily Lovenox injections throughout the pregnancy and postpartum.   Her parents have both passed away. She was 16yo when her Mother passed. Her Father passed away. Pt states she has an Aunt who is supportive. She is doing well emotionally. Her last [facility] 1 year ago [facility]   The following portions of the patient's history were reviewed and updated as appropriate: allergies, current medications, past family history, past medical history, past social history, past surgical history, and problem list.   Review of Systems Pertinent items are noted in HPI.    Objective:    BP 136/87   Pulse 87   Ht 5' 6 (1.676 m) Comment: Reported  Wt 104 lb 9.6 oz (47.4 kg)   LMP 12/31/2023 (Approximate)   BMI 16.88 kg/m  General appearance: alert, cooperative, appears stated age, and no distress    Assessment:    PreConception Counseling.    Plan:    Pt will continue to track periods. She will RTO in 6 months if she has not yet conceived. Taking PNV daily Imunique Samad K Emeka Lindner

## 2024-01-11 ENCOUNTER — Ambulatory Visit: Admitting: Cardiology

## 2024-01-18 ENCOUNTER — Ambulatory Visit: Payer: Self-pay

## 2024-01-22 ENCOUNTER — Ambulatory Visit: Payer: Self-pay | Attending: Cardiology

## 2024-01-22 ENCOUNTER — Other Ambulatory Visit: Payer: Self-pay

## 2024-01-22 ENCOUNTER — Other Ambulatory Visit (HOSPITAL_COMMUNITY): Payer: Self-pay

## 2024-01-22 DIAGNOSIS — Z7901 Long term (current) use of anticoagulants: Secondary | ICD-10-CM

## 2024-01-22 DIAGNOSIS — Z952 Presence of prosthetic heart valve: Secondary | ICD-10-CM

## 2024-01-22 DIAGNOSIS — Z5181 Encounter for therapeutic drug level monitoring: Secondary | ICD-10-CM

## 2024-01-22 LAB — POCT INR: INR: 2 (ref 2.0–3.0)

## 2024-01-22 MED ORDER — WARFARIN SODIUM 2.5 MG PO TABS
6.2500 mg | ORAL_TABLET | Freq: Every day | ORAL | 2 refills | Status: DC
  Filled 2024-01-22: qty 80, 30d supply, fill #0

## 2024-01-22 MED ORDER — METOPROLOL TARTRATE 25 MG PO TABS
25.0000 mg | ORAL_TABLET | Freq: Two times a day (BID) | ORAL | 3 refills | Status: DC
  Filled 2024-01-22: qty 180, 90d supply, fill #0

## 2024-01-22 NOTE — Patient Instructions (Signed)
 start taking warfarin 3 tablets daily except 2.5 tablets on Wednesday Continue eating 1 leafy vegetable weekly. Recheck INR in 3 weeks.  Anticoagulation Clinic [phone]

## 2024-01-22 NOTE — Progress Notes (Signed)
 INR 2.0 Please see anticoagulation encounter start taking warfarin 3 tablets daily except 2.5 tablets on Wednesday Continue eating 1 leafy vegetable weekly. Recheck INR in 3 weeks.  Anticoagulation Clinic [phone]

## 2024-01-23 ENCOUNTER — Other Ambulatory Visit (HOSPITAL_COMMUNITY): Payer: Self-pay

## 2024-02-12 ENCOUNTER — Ambulatory Visit: Payer: Self-pay | Attending: Cardiology | Admitting: *Deleted

## 2024-02-12 DIAGNOSIS — Z952 Presence of prosthetic heart valve: Secondary | ICD-10-CM

## 2024-02-12 DIAGNOSIS — Z7901 Long term (current) use of anticoagulants: Secondary | ICD-10-CM

## 2024-02-12 DIAGNOSIS — Z5181 Encounter for therapeutic drug level monitoring: Secondary | ICD-10-CM

## 2024-02-12 LAB — POCT INR: INR: 2.6 (ref 2.0–3.0)

## 2024-02-12 NOTE — Patient Instructions (Signed)
 Description   INR-2.6; Continue taking warfarin 3 tablets daily except 2.5 tablets on Wednesday. Continue eating 1 leafy vegetable weekly. Recheck INR in 3 weeks.  Anticoagulation Clinic [phone]

## 2024-02-12 NOTE — Progress Notes (Signed)
 Description   INR-2.6; Continue taking warfarin 3 tablets daily except 2.5 tablets on Wednesday. Continue eating 1 leafy vegetable weekly. Recheck INR in 3 weeks.  Anticoagulation Clinic [phone]

## 2024-02-25 ENCOUNTER — Other Ambulatory Visit: Payer: Self-pay

## 2024-02-25 ENCOUNTER — Encounter (HOSPITAL_BASED_OUTPATIENT_CLINIC_OR_DEPARTMENT_OTHER): Payer: Self-pay | Admitting: Certified Nurse Midwife

## 2024-02-25 ENCOUNTER — Other Ambulatory Visit (HOSPITAL_COMMUNITY): Payer: Self-pay

## 2024-02-25 DIAGNOSIS — Z952 Presence of prosthetic heart valve: Secondary | ICD-10-CM

## 2024-02-25 MED ORDER — WARFARIN SODIUM 2.5 MG PO TABS
6.2500 mg | ORAL_TABLET | Freq: Every day | ORAL | 2 refills | Status: DC
  Filled 2024-02-25: qty 80, 30d supply, fill #0
  Filled 2024-03-26: qty 80, 30d supply, fill #1
  Filled 2024-04-18 (×3): qty 80, 30d supply, fill #2

## 2024-02-26 ENCOUNTER — Encounter (HOSPITAL_BASED_OUTPATIENT_CLINIC_OR_DEPARTMENT_OTHER): Payer: Self-pay | Admitting: Certified Nurse Midwife

## 2024-02-26 ENCOUNTER — Ambulatory Visit (HOSPITAL_BASED_OUTPATIENT_CLINIC_OR_DEPARTMENT_OTHER): Payer: Self-pay | Admitting: Certified Nurse Midwife

## 2024-02-26 VITALS — BP 127/82 | HR 98 | Ht 66.0 in | Wt 107.0 lb

## 2024-02-26 DIAGNOSIS — Z3169 Encounter for other general counseling and advice on procreation: Secondary | ICD-10-CM

## 2024-02-26 NOTE — Progress Notes (Signed)
 Subjective:    Anita Salazar is a 23 y.o. female here for follow-up from 11/01/23 and 01/08/24 (plan was to return to office in 6 months). Pt concerned that she still has not conceived and would like information and how her boyfriend can get a Semen Analysis.  The following portions of the patient's history were reviewed and updated as appropriate: allergies, current medications, past family history, past medical history, past social history, past surgical history, and problem list.   Review of Systems Pertinent items are noted in HPI.    Objective:    BP 127/82 (BP Location: Right Arm, Patient Position: Sitting, Cuff Size: Normal)   Pulse 98   Ht 5' 6 (1.676 m)   Wt 107 lb (48.5 kg)   LMP 02/24/2024 (Exact Date)   SpO2 100%   BMI 17.27 kg/m  General appearance: alert and cooperative    Assessment:    PreConception Counseling.    Plan:    TSH,  CBC pending Information provided on how to contact [facility] to obtain semen analysis. RTO in 07/2024 if she has not conceived.  Arland MARLA Roller

## 2024-02-27 ENCOUNTER — Ambulatory Visit (HOSPITAL_BASED_OUTPATIENT_CLINIC_OR_DEPARTMENT_OTHER): Payer: Self-pay | Admitting: Certified Nurse Midwife

## 2024-02-27 LAB — CBC
Hematocrit: 38.1 % (ref 34.0–46.6)
Hemoglobin: 12.3 g/dL (ref 11.1–15.9)
MCH: 28.6 pg (ref 26.6–33.0)
MCHC: 32.3 g/dL (ref 31.5–35.7)
MCV: 89 fL (ref 79–97)
Platelets: 240 x10E3/uL (ref 150–450)
RBC: 4.3 x10E6/uL (ref 3.77–5.28)
RDW: 13 % (ref 11.7–15.4)
WBC: 6.4 x10E3/uL (ref 3.4–10.8)

## 2024-02-27 LAB — THYROID PANEL WITH TSH
Free Thyroxine Index: 2.1 (ref 1.2–4.9)
T3 Uptake Ratio: 21 % — ABNORMAL LOW (ref 24–39)
T4, Total: 10.2 ug/dL (ref 4.5–12.0)
TSH: 0.92 u[IU]/mL (ref 0.450–4.500)

## 2024-03-04 ENCOUNTER — Ambulatory Visit: Payer: Self-pay | Admitting: Student

## 2024-03-04 ENCOUNTER — Encounter: Payer: Self-pay | Admitting: Student

## 2024-03-04 VITALS — BP 112/83 | HR 81 | Ht 66.0 in | Wt 105.8 lb

## 2024-03-04 DIAGNOSIS — Z0000 Encounter for general adult medical examination without abnormal findings: Secondary | ICD-10-CM

## 2024-03-04 NOTE — Progress Notes (Signed)
    SUBJECTIVE:   CHIEF COMPLAINT / HPI:   KARYSS FRESE is a 23 y.o. female presenting for work form completion. 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   Doing well no concerns. Following with specialists for warfarin management, infertility and mitral valve replacement.   PERTINENT  PMH / PSH: reviewed and updated.  OBJECTIVE:   BP 112/83   Pulse 81   Ht 5' 6 (1.676 m)   Wt 105 lb 12.8 oz (48 kg)   LMP 02/24/2024 (Exact Date)   SpO2 100%   BMI 17.08 kg/m   Well-appearing, no acute distress Cardio: Regular rate, regular rhythm, no murmurs on exam. Pulm: Clear, no wheezing, no crackles. No increased work of breathing Abdominal: bowel sounds present, soft, non-tender, non-distended Extremities: no peripheral edema   ASSESSMENT/PLAN:   Assessment & Plan Adult wellness visit Due for pap smear, patient to schedule appointment  Flu vaccine given today    Form completed for work   Damien Pinal, DO [facility]

## 2024-03-04 NOTE — Patient Instructions (Addendum)
 It was great to see you today!   You are due for a pap smear. Schedule this when you are ready  Future Appointments  Date Time Provider Department Center  03/07/2024  3:00 PM Tobb, Kardie, DO [facility] None  03/07/2024  3:30 PM [facility] COUMADIN  CLINIC 2 [facility] H&V    Please arrive 15 minutes before your appointment to ensure smooth check in process.  If you are more than 15 minutes late, you may be asked to reschedule.   Please bring a list of your medications with you to all appointments.   Please call the clinic at [phone] if your symptoms worsen or you have any concerns.  Thank you for allowing me to participate in your care, Dr. Damien Pinal [facility]

## 2024-03-07 ENCOUNTER — Ambulatory Visit: Payer: Self-pay | Admitting: Cardiology

## 2024-03-07 ENCOUNTER — Ambulatory Visit: Payer: Self-pay | Attending: Cardiology

## 2024-03-07 DIAGNOSIS — Z952 Presence of prosthetic heart valve: Secondary | ICD-10-CM

## 2024-03-07 DIAGNOSIS — Z7901 Long term (current) use of anticoagulants: Secondary | ICD-10-CM

## 2024-03-07 DIAGNOSIS — Z5181 Encounter for therapeutic drug level monitoring: Secondary | ICD-10-CM

## 2024-03-07 LAB — POCT INR: INR: 3 (ref 2.0–3.0)

## 2024-03-07 NOTE — Patient Instructions (Signed)
 Continue taking warfarin 3 tablets daily except 2.5 tablets on Wednesday. Continue eating 1 leafy vegetable weekly. Recheck INR in 6 weeks.  Anticoagulation Clinic [phone]

## 2024-03-07 NOTE — Progress Notes (Signed)
 INR 3.0 Please see anticoagulation encounter Continue taking warfarin 3 tablets daily except 2.5 tablets on Wednesday. Continue eating 1 leafy vegetable weekly. Recheck INR in 6 weeks.  Anticoagulation Clinic [phone]

## 2024-03-14 ENCOUNTER — Ambulatory Visit: Payer: Self-pay | Admitting: Family Medicine

## 2024-03-27 ENCOUNTER — Other Ambulatory Visit (HOSPITAL_COMMUNITY): Payer: Self-pay

## 2024-04-04 ENCOUNTER — Ambulatory Visit: Payer: Self-pay | Admitting: Cardiology

## 2024-04-04 ENCOUNTER — Encounter: Payer: Self-pay | Admitting: Cardiology

## 2024-04-04 VITALS — BP 126/82 | HR 90 | Ht 67.0 in | Wt 109.0 lb

## 2024-04-04 DIAGNOSIS — Z952 Presence of prosthetic heart valve: Secondary | ICD-10-CM

## 2024-04-04 DIAGNOSIS — I340 Nonrheumatic mitral (valve) insufficiency: Secondary | ICD-10-CM

## 2024-04-04 NOTE — Patient Instructions (Addendum)
 Medication Instructions:  Your physician recommends that you continue on your current medications as directed. Please refer to the Current Medication list given to you today.  *If you need a refill on your cardiac medications before your next appointment, please call your pharmacy*   Testing/Procedures: Your physician has requested that you have an echocardiogram in 7 months. Echocardiography is a painless test that uses sound waves to create images of your heart. It provides your doctor with information about the size and shape of your heart and how well your heart's chambers and valves are working. This procedure takes approximately one hour. There are no restrictions for this procedure. Please do NOT wear cologne, perfume, aftershave, or lotions (deodorant is allowed). Please arrive 15 minutes prior to your appointment time.  Please note: We ask at that you not bring children with you during ultrasound (echo/ vascular) testing. Due to room size and safety concerns, children are not allowed in the ultrasound rooms during exams. Our front office staff cannot provide observation of children in our lobby area while testing is being conducted. An adult accompanying a patient to their appointment will only be allowed in the ultrasound room at the discretion of the ultrasound technician under special circumstances. We apologize for any inconvenience.   Follow-Up: At [facility], you and your health needs are our priority.  As part of our continuing mission to provide you with exceptional heart care, our providers are all part of one team.  This team includes your primary Cardiologist (physician) and Advanced Practice Providers or APPs (Physician Assistants and Nurse Practitioners) who all work together to provide you with the care you need, when you need it.  Your next appointment:    After in echo   Provider:   Kardie Tobb, DO    Other instructions: High-Protein and High-Calorie  Diet Eating high-protein and high-calorie foods can help you to gain weight, heal after an injury, and get better after an illness or surgery. The amount of daily protein and calories you need depends on: Your body weight. The reason you were told to follow this diet. Usually, a high-protein, high-calorie diet means that you should: Eat 250-500 extra calories each day. Make sure that you get enough of your daily calories from protein. Ask your health care provider how many of your calories should come from protein and how many calories total you need each day. Follow the diet as told by your provider. What are tips for following this plan? Reading food labels Check the nutrition facts label for calories, and grams of fat and protein. Items with more than 4 grams of protein are high-protein foods. General information  Ask your provider if you should take a nutritional supplement. Try to eat six small meals each day instead of three large meals. A goal is usually to eat every 2 to 3 hours. Eat a balanced diet. In each meal, include one food that's high in protein and one food with fat in it. Keep nutritious snacks available, such as nuts, trail mixes, dried fruit, and whole-milk yogurt. If you have kidney disease or diabetes, talk with your provider about how much protein is safe for you. Too much protein may put extra stress on your kidneys. Replace zero-calorie drinks with drinks that have calories in them, such as milk and 100% fruit juice. Consider setting a timer to remind you to eat. You'll want to eat even if you do not feel very hungry. Preparing meals Milk and dairy foods. Add  whole milk, half-and-half, or heavy cream to cereal, pudding, soup, or hot cocoa. Add whole milk to instant breakfast drinks. Add powdered milk to baked goods, smoothies, or milkshakes. Add powdered milk, cream, or butter to mashed potatoes. Replace water with milk or heavy cream when making foods such as  oatmeal, pudding, or cocoa. Make cream-based pastas and soups. Add cheese to cooked vegetables. Make whole-milk yogurt parfaits. Top them with granola, fruit, or nuts. Add cottage cheese to fruit. Add cream cheese to sandwiches or as a topping on crackers and bread. Eggs. Add hard-boiled eggs to salads. Keep hard-boiled eggs in the fridge to snack on. Add cheese to cooked eggs. Beans, nuts, and seeds. Add peanut butter to oatmeal or smoothies. Use peanut butter as a dip for fruits and vegetables or as a topping for pretzels, celery, or crackers. Add beans to casseroles, dips, and spreads. Add pureed beans to sauces and soups. Salads, soups, and other foods. Add avocado, cheese, or both to sandwiches or salads. Add avocado to smoothies. Add meat, poultry, or seafood to rice, pasta, casseroles, salads, and soups. Use mayonnaise when making egg salad, chicken salad, or tuna salad. Add oil or butter to cooked vegetables and grains. What high-protein foods should I eat?  Vegetables Soybeans. Peas. Grains Quinoa. Bulgur wheat. Buckwheat. Meats and other proteins Beef, pork, and poultry. Fish and seafood. Eggs. Tofu. Textured vegetable protein (TVP). Peanut butter. Nuts and seeds. Dried beans. Protein powders. Hummus. Jerky. Dairy Whole milk. Whole-milk yogurt. Powdered milk. Cheese. Cottage cheese. Eggnog. Beverages High-protein supplement drinks. Soy milk. Other foods Protein bars. The items listed above may not be all the foods and drinks you can have. Talk with an expert in healthy eating called a dietitian to learn more. What high-calorie foods should I eat? Fruits Dried fruit. Fruit leather. Canned fruit in syrup. Fruit juice. Avocado. Vegetables Vegetables cooked in oil or butter. Fried potatoes. Grains Pasta. Quick breads. Muffins. Pancakes. Granola. Meats and other proteins Peanut butter and other nut butters. Nuts and seeds. Dairy Heavy cream. Whipped cream. Cream  cheese. Sour cream. Ice cream. Custard. Pudding. Whole-milk dairy products. Beverages Meal-replacement beverages. Nutrition shakes. Fruit juice. Seasonings and condiments Salad dressing. Mayonnaise. Alfredo sauce. Fruit preserves or jelly. Honey. Syrup. Sweets and desserts Cake. Cookies. Pie. Pastries. Candy bars. Chocolate. Fats and oils Butter or margarine. Oil. Gravy. Other foods Meal-replacement bars. The items listed above may not be all the foods and drinks you can have. Talk with an expert in healthy eating to learn more. This information is not intended to replace advice given to you by your health care provider. Make sure you discuss any questions you have with your health care provider. Document Revised: 10/16/2022 Document Reviewed: 10/16/2022 Elsevier Patient Education  2024 [employer].

## 2024-04-11 NOTE — Progress Notes (Signed)
 Cardio-Obstetrics Clinic  Follow Up Note   Date:  04/11/2024   ID:  Anita Salazar, DOB [birth year], MRN [id number]  PCP:  Cleotilde Perkins, DO   [facility] Providers Cardiologist:  Dub Huntsman, DO  Electrophysiologist:  None        Referring MD: Cleotilde Perkins, DO   Chief Complaint:  I am doing well  History of Present Illness:    Anita Salazar is a 23 y.o. female [[id number]] who returns for follow up.  Medical history includes mitral valve replacement with mechanical valve,   Her visit with me was 03/2023 at that time I advised the patient not to conceive until she has her severe mitral regurgitation address due severe primary mitral regurgitation. She was initially refer to CT surgery at [facility] who recommended waiting and monitoring and reported high likelihood for mitral valve repair. She then started to experience shortness of breath,  and fatigue and with her desire to conceive and her valve disease making her high risk for potential complication - I send the patient to [facility] for a second opinion  on her request and  she was deemed appropriate to proceed with a MVR.  She underwent surgery in December 2024.   Prior visit she has expressed interest in conception - she did see MFM as well. I have dicsussed with her that a future pregnancy will  be a very high risk pregnancy.   Since her last visit she had an echo. The echo showed evidence to tricuspid valve.   Since her last visit she has missed a couple of her Coumadin  visits, with the last visit being approximately one month ago. She is experiencing weight loss and is attempting to gain weight. Her appetite fluctuates, sometimes feeling hungry but losing the desire to eat when food is available.   No other complaints at this time.  Prior CV Studies Reviewed: The following studies were reviewed today: Echo on 10/2023 IMPRESSIONS     1. Left ventricular ejection fraction, by estimation, is 50  to 55%. The  left ventricle has low normal function. The left ventricle has no regional  wall motion abnormalities. Left ventricular diastolic parameters are  indeterminate. The average left  ventricular global longitudinal strain is -21.1 %. The global longitudinal  strain is normal.   2. Right ventricular systolic function is normal. The right ventricular  size is normal.   3. The mitral valve has been repaired/replaced. No evidence of mitral valve regurgitation. No evidence of mitral stenosis. The mean mitral valve gradient is 3.5 mmHg with average heart rate of 85 bpm.   4. Tricuspid valve regurgitation is moderate to severe.   5. The aortic valve is tricuspid. Aortic valve regurgitation is not  visualized. No aortic stenosis is present.   Comparison(s): Prior images reviewed side by side. S/p MVR. Tricuspid  valve regurgitation is prominent.   FINDINGS   Left Ventricle: No 3D transmitted. Left ventricular ejection fraction, by  estimation, is 50 to 55%. The left ventricle has low normal function. The  left ventricle has no regional wall motion abnormalities. The average left  ventricular global  longitudinal strain is -21.1 %. Strain was performed and the global  longitudinal strain is normal. The left ventricular internal cavity size  was normal in size. Suboptimal image quality limits for assessment of left  ventricular hypertrophy. Abnormal  (paradoxical) septal motion consistent with post-operative status. Left  ventricular diastolic parameters are indeterminate.   Right Ventricle: The right ventricular  size is normal. No increase in  right ventricular wall thickness. Right ventricular systolic function is  normal.   Left Atrium: Left atrial size was normal in size.   Right Atrium: Right atrial size was normal in size.   Pericardium: There is no evidence of pericardial effusion.   Mitral Valve: The mitral valve has been repaired/replaced. No evidence of  mitral valve  regurgitation. There is a 27 mm [employer] mechanical valve  present in the mitral position. No evidence of mitral valve stenosis. MV  peak gradient, 5.1 mmHg. The mean  mitral valve gradient is 3.5 mmHg with average heart rate of 85 bpm.   Tricuspid Valve: The tricuspid valve is normal in structure. Tricuspid  valve regurgitation is moderate to severe. No evidence of tricuspid  stenosis.   Aortic Valve: The aortic valve is tricuspid. Aortic valve regurgitation is  not visualized. No aortic stenosis is present.   Pulmonic Valve: The pulmonic valve was normal in structure. Pulmonic valve  regurgitation is not visualized. No evidence of pulmonic stenosis.   Aorta: The aortic root and ascending aorta are structurally normal, with  no evidence of dilitation.   IAS/Shunts: The atrial septum is grossly normal.    Past Medical History:  Diagnosis Date   Mitral regurgitation    MVP (mitral valve prolapse)     Past Surgical History:  Procedure Laterality Date   MITRAL VALVE REPLACEMENT  05/21/2023   Dr. Ricky at [facility]   TEE WITHOUT CARDIOVERSION N/A 03/13/2023   Procedure: TRANSESOPHAGEAL ECHOCARDIOGRAM;  Surgeon: Lonni Slain, MD;  Location: [facility] INVASIVE CV LAB;  Service: Cardiovascular;  Laterality: N/A;      OB History     Gravida  0   Para  0   Term  0   Preterm  0   AB  0   Living  0      SAB  0   IAB  0   Ectopic  0   Multiple  0   Live Births  0               Current Medications: Current Meds  Medication Sig   aspirin 81 MG chewable tablet Chew 81 mg by mouth daily.   ibuprofen  (ADVIL ) 800 MG tablet Take 1 tablet (800 mg total) by mouth every 8 (eight) hours as needed.   metoprolol  tartrate (LOPRESSOR ) 25 MG tablet Take 1 tablet (25 mg total) by mouth 2 (two) times daily.   prenatal vitamin w/FE, FA (PRENATAL 1 + 1) 27-1 MG TABS tablet Take 1 tablet by mouth daily at 12 noon.   warfarin (COUMADIN ) 2.5 MG tablet Take 2&1/2 to 3 tablets  (6.25-7.5 mg total) by mouth daily or as directed by Anticoagulation Clinic.     Allergies:   Patient has no known allergies.   Social History   Socioeconomic History   Marital status: Single    Spouse name: Not on file   Number of children: Not on file   Years of education: Not on file   Highest education level: Not on file  Occupational History   Not on file  Tobacco Use   Smoking status: Never   Smokeless tobacco: Never  Vaping Use   Vaping status: Former  Substance and Sexual Activity   Alcohol use: Not Currently   Drug use: Yes    Types: Marijuana    Comment: 'here and there', 'i hit it a couple times last night'   Sexual activity: Yes  Birth control/protection: None  Other Topics Concern   Not on file  Social History Narrative   Lives with boyfriend.    Social Drivers of Corporate Investment Banker Strain: Not on file  Food Insecurity: Not on file  Transportation Needs: No Transportation Needs (05/21/2023)   [facility] - Transportation    In the past 12 months, has lack of transportation kept you from medical appointments or from getting medications?: No    Lack of Transportation (Non-Medical): No  Physical Activity: Not on file  Stress: Not on file  Social Connections: Not on file      Family History  Problem Relation Age of Onset   Diabetes Mother    Heart disease Father        No details      ROS:   Please see the history of present illness.    none All other systems reviewed and are negative.   Labs/EKG Reviewed:    EKG:  None today  Recent Labs: 02/26/2024: Hemoglobin 12.3; Platelets 240; TSH 0.920   Recent Lipid Panel No results found for: CHOL, TRIG, HDL, CHOLHDL, LDLCALC, LDLDIRECT  Physical Exam:    VS:  BP 126/82 (BP Location: Left Arm, Patient Position: Sitting, Cuff Size: Normal)   Pulse 90   Ht 5' 7 (1.702 m)   Wt 109 lb (49.4 kg)   SpO2 99%   BMI 17.07 kg/m      Wt Readings from Last 3 Encounters:  04/04/24 109 lb (49.4 kg)  03/04/24 105 lb 12.8 oz (48 kg)  02/26/24 107 lb (48.5 kg)     GEN:  Well nourished, well developed in no acute distress HEENT: Normal NECK: No JVD; No carotid bruits LYMPHATICS: No lymphadenopathy CARDIAC: RRR, mechanical click, noted holosystolic murmurs, rubs, gallops RESPIRATORY:  Clear to auscultation without rales, wheezing or rhonchi  ABDOMEN: Soft, non-tender, non-distended MUSCULOSKELETAL:  No edema; No deformity  SKIN: Warm and dry NEUROLOGIC:  Alert and oriented x 3 PSYCHIATRIC:  Normal affect    Risk Assessment/Risk Calculators:                 ASSESSMENT & PLAN:    Status post mechanical mitral valve due to primary severe mitral regurgitation Chronic anticoagulation  Tricuspid Regurgitation   She is currently on coumadin  and follows with our coumadin  clinic. She is aware of the dietary restrictions.  She will continue to follow with our coumadin  clinic.    Clinically she denies any symptoms of shortness of breath, no edema. I have educated the patient on sign of fluid overload.   Today she does not express interest is pregnancy preparation.  Previous CV pregnancy risk  using the CARPREG score she is 41% for primary cardiac events making her a very high risk for maternal mortality.  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  She will need expert care with a cardioobstetrics multidisplinary team approach, close follow up - yet understanding that the undertaking of getting pregnant is a significant risk although it is not completely contraindicated.    Will need a repeat echo and this will be scheduled for may 2026    Patient Instructions  Medication Instructions:  Your physician recommends  that you continue on your current medications as directed. Please refer  to the Current Medication list given to you today.  *If you need a refill on your cardiac medications before your next appointment, please call your pharmacy*   Testing/Procedures: Your physician has requested that you have an echocardiogram in 7 months. Echocardiography is a painless test that uses sound waves to create images of your heart. It provides your doctor with information about the size and shape of your heart and how well your heart's chambers and valves are working. This procedure takes approximately one hour. There are no restrictions for this procedure. Please do NOT wear cologne, perfume, aftershave, or lotions (deodorant is allowed). Please arrive 15 minutes prior to your appointment time.  Please note: We ask at that you not bring children with you during ultrasound (echo/ vascular) testing. Due to room size and safety concerns, children are not allowed in the ultrasound rooms during exams. Our front office staff cannot provide observation of children in our lobby area while testing is being conducted. An adult accompanying a patient to their appointment will only be allowed in the ultrasound room at the discretion of the ultrasound technician under special circumstances. We apologize for any inconvenience.   Follow-Up: At [facility], you and your health needs are our priority.  As part of our continuing mission to provide you with exceptional heart care, our providers are all part of one team.  This team includes your primary Cardiologist (physician) and Advanced Practice Providers or APPs (Physician Assistants and Nurse Practitioners) who all work together to provide you with the care you need, when you need it.  Your next appointment:    After in echo   Provider:   Adianna Darwin, DO    Other instructions: High-Protein and High-Calorie Diet Eating high-protein and high-calorie foods can help  you to gain weight, heal after an injury, and get better after an illness or surgery. The amount of daily protein and calories you need depends on: Your body weight. The reason you were told to follow this diet. Usually, a high-protein, high-calorie diet means that you should: Eat 250-500 extra calories each day. Make sure that you get enough of your daily calories from protein. Ask your health care provider how many of your calories should come from protein and how many calories total you need each day. Follow the diet as told by your provider. What are tips for following this plan? Reading food labels Check the nutrition facts label for calories, and grams of fat and protein. Items with more than 4 grams of protein are high-protein foods. General information  Ask your provider if you should take a nutritional supplement. Try to eat six small meals each day instead of three large meals. A goal is usually to eat every 2 to 3 hours. Eat a balanced diet. In each meal, include one food that's high in protein and one food with fat in it. Keep nutritious snacks available, such as nuts, trail mixes, dried fruit, and whole-milk yogurt. If you have kidney disease or diabetes, talk with your provider about how much protein is safe for you. Too much protein may put extra stress on your kidneys. Replace zero-calorie drinks with drinks that have calories in them, such as milk and 100% fruit juice. Consider setting a timer to remind you to eat. You'll want to eat even if you do not feel very hungry. Preparing meals Milk and dairy foods. Add whole milk, half-and-half, or heavy cream to cereal, pudding, soup, or hot cocoa. Add whole milk to  instant breakfast drinks. Add powdered milk to baked goods, smoothies, or milkshakes. Add powdered milk, cream, or butter to mashed potatoes. Replace water with milk or heavy cream when making foods such as oatmeal, pudding, or cocoa. Make cream-based pastas and  soups. Add cheese to cooked vegetables. Make whole-milk yogurt parfaits. Top them with granola, fruit, or nuts. Add cottage cheese to fruit. Add cream cheese to sandwiches or as a topping on crackers and bread. Eggs. Add hard-boiled eggs to salads. Keep hard-boiled eggs in the fridge to snack on. Add cheese to cooked eggs. Beans, nuts, and seeds. Add peanut butter to oatmeal or smoothies. Use peanut butter as a dip for fruits and vegetables or as a topping for pretzels, celery, or crackers. Add beans to casseroles, dips, and spreads. Add pureed beans to sauces and soups. Salads, soups, and other foods. Add avocado, cheese, or both to sandwiches or salads. Add avocado to smoothies. Add meat, poultry, or seafood to rice, pasta, casseroles, salads, and soups. Use mayonnaise when making egg salad, chicken salad, or tuna salad. Add oil or butter to cooked vegetables and grains. What high-protein foods should I eat?  Vegetables Soybeans. Peas. Grains Quinoa. Bulgur wheat. Buckwheat. Meats and other proteins Beef, pork, and poultry. Fish and seafood. Eggs. Tofu. Textured vegetable protein (TVP). Peanut butter. Nuts and seeds. Dried beans. Protein powders. Hummus. Jerky. Dairy Whole milk. Whole-milk yogurt. Powdered milk. Cheese. Cottage cheese. Eggnog. Beverages High-protein supplement drinks. Soy milk. Other foods Protein bars. The items listed above may not be all the foods and drinks you can have. Talk with an expert in healthy eating called a dietitian to learn more. What high-calorie foods should I eat? Fruits Dried fruit. Fruit leather. Canned fruit in syrup. Fruit juice. Avocado. Vegetables Vegetables cooked in oil or butter. Fried potatoes. Grains Pasta. Quick breads. Muffins. Pancakes. Granola. Meats and other proteins Peanut butter and other nut butters. Nuts and seeds. Dairy Heavy cream. Whipped cream. Cream cheese. Sour cream. Ice cream. Custard. Pudding. Whole-milk  dairy products. Beverages Meal-replacement beverages. Nutrition shakes. Fruit juice. Seasonings and condiments Salad dressing. Mayonnaise. Alfredo sauce. Fruit preserves or jelly. Honey. Syrup. Sweets and desserts Cake. Cookies. Pie. Pastries. Candy bars. Chocolate. Fats and oils Butter or margarine. Oil. Gravy. Other foods Meal-replacement bars. The items listed above may not be all the foods and drinks you can have. Talk with an expert in healthy eating to learn more. This information is not intended to replace advice given to you by your health care provider. Make sure you discuss any questions you have with your health care provider. Document Revised: 10/16/2022 Document Reviewed: 10/16/2022 Elsevier Patient Education  2024 [employer]   Dispo:  No follow-ups on file.   Medication Adjustments/Labs and Tests Ordered: Current medicines are reviewed at length with the patient today.  Concerns regarding medicines are outlined above.  Tests Ordered: Orders Placed This Encounter  Procedures   ECHOCARDIOGRAM COMPLETE   Medication Changes: No orders of the defined types were placed in this encounter.

## 2024-04-16 ENCOUNTER — Telehealth: Payer: Self-pay | Admitting: Pharmacist

## 2024-04-16 NOTE — Telephone Encounter (Signed)
 Patient called to ask about spinach. Advised it has a large amount of Vitamin K and she should only eat it it if she can be consistent week to week with her intake. Patient voiced understanding.

## 2024-04-18 ENCOUNTER — Other Ambulatory Visit: Payer: Self-pay

## 2024-04-18 ENCOUNTER — Other Ambulatory Visit (HOSPITAL_COMMUNITY): Payer: Self-pay

## 2024-04-18 ENCOUNTER — Ambulatory Visit: Payer: Self-pay | Attending: Cardiology

## 2024-04-18 DIAGNOSIS — Z7901 Long term (current) use of anticoagulants: Secondary | ICD-10-CM

## 2024-04-18 DIAGNOSIS — Z952 Presence of prosthetic heart valve: Secondary | ICD-10-CM

## 2024-04-18 DIAGNOSIS — Z5181 Encounter for therapeutic drug level monitoring: Secondary | ICD-10-CM

## 2024-04-18 LAB — POCT INR: INR: 3.9 — AB (ref 2.0–3.0)

## 2024-04-18 NOTE — Progress Notes (Signed)
 INR 3.9 Please see anticoagulation encounter   Continue taking warfarin 3 tablets daily except 2.5 tablets on Wednesday. Continue eating 1 leafy vegetable weekly. Recheck INR in 6 weeks.  Anticoagulation Clinic [phone]. Eat greens this weekend.

## 2024-04-18 NOTE — Patient Instructions (Signed)
 Continue taking warfarin 3 tablets daily except 2.5 tablets on Wednesday. Continue eating 1 leafy vegetable weekly. Recheck INR in 6 weeks.  Anticoagulation Clinic [phone]. Eat greens this weekend.

## 2024-05-09 ENCOUNTER — Telehealth: Payer: Self-pay | Admitting: Pharmacist

## 2024-05-09 NOTE — Telephone Encounter (Signed)
 Patient called. Is being started on cyproheptadine for weight gain. Asked regarding interactions. Advised no interactions between med and warfarin, metoprolol , or her OC. Pt voiced understanding.

## 2024-05-12 ENCOUNTER — Encounter (HOSPITAL_BASED_OUTPATIENT_CLINIC_OR_DEPARTMENT_OTHER): Payer: Self-pay | Admitting: Certified Nurse Midwife

## 2024-05-14 ENCOUNTER — Other Ambulatory Visit: Payer: Self-pay | Admitting: Cardiology

## 2024-05-14 ENCOUNTER — Ambulatory Visit (HOSPITAL_BASED_OUTPATIENT_CLINIC_OR_DEPARTMENT_OTHER): Payer: Self-pay | Admitting: Certified Nurse Midwife

## 2024-05-14 ENCOUNTER — Other Ambulatory Visit (HOSPITAL_COMMUNITY): Payer: Self-pay

## 2024-05-14 NOTE — Telephone Encounter (Signed)
 Pt of Dr. Sheena. Requesting a refill of this RX. This looks like it was previously discontinued and restarted by a different provider. At last O/V, Dr. Sheena didn't mention this RX. Does Dr. Sheena want to refill? Please advise.

## 2024-05-15 ENCOUNTER — Other Ambulatory Visit (HOSPITAL_COMMUNITY): Payer: Self-pay

## 2024-05-15 MED ORDER — METOPROLOL TARTRATE 25 MG PO TABS
25.0000 mg | ORAL_TABLET | Freq: Two times a day (BID) | ORAL | 3 refills | Status: AC
  Filled 2024-05-15: qty 180, 90d supply, fill #0

## 2024-05-15 NOTE — Telephone Encounter (Signed)
 Pt called to f/u on her getting this medication refilled. She stated she'd like to know something before taking her last tablet tomorrow. Please advise

## 2024-05-15 NOTE — Telephone Encounter (Signed)
 Reviewed chart, patient taking Lopressor  at time of visit with Dr. Sheena, no recent medication changes, looks to have been refilled by Dr. Sheena previously.  Called and spoke with patient to confirm which pharmacy she would like refill sent to. Refill sent, patient expressed appreciation for call.

## 2024-05-16 ENCOUNTER — Telehealth: Payer: Self-pay | Admitting: Student

## 2024-05-16 ENCOUNTER — Ambulatory Visit (HOSPITAL_COMMUNITY): Payer: Self-pay

## 2024-05-16 NOTE — Telephone Encounter (Signed)
 Patient called that about an hour after taking her PM Warfarin she had vomiting and diarrhea. She did not note any pill in the emesis. Wanted instructions on what to do next. Discussed that she should just take her next dose as scheduled and reach out to cardiology office about scheduling INR this week or next to ensure stability.

## 2024-05-20 ENCOUNTER — Other Ambulatory Visit: Payer: Self-pay | Admitting: *Deleted

## 2024-05-20 ENCOUNTER — Other Ambulatory Visit (HOSPITAL_COMMUNITY): Payer: Self-pay

## 2024-05-20 DIAGNOSIS — Z952 Presence of prosthetic heart valve: Secondary | ICD-10-CM

## 2024-05-20 MED ORDER — WARFARIN SODIUM 2.5 MG PO TABS
6.2500 mg | ORAL_TABLET | Freq: Every day | ORAL | 2 refills | Status: AC
  Filled 2024-05-20: qty 85, 30d supply, fill #0
  Filled 2024-06-19: qty 85, 30d supply, fill #1
  Filled 2024-06-19: qty 55, 18d supply, fill #1
  Filled 2024-07-06: qty 55, 18d supply, fill #2

## 2024-05-20 NOTE — Telephone Encounter (Signed)
 Warfarin 2.5mg  Dx-History of mitral valve replacement  Last INR Check-04/18/24 Last OV- 04/04/24

## 2024-05-23 ENCOUNTER — Ambulatory Visit: Payer: Self-pay | Admitting: Cardiology

## 2024-05-30 ENCOUNTER — Ambulatory Visit: Payer: Self-pay | Attending: Cardiology

## 2024-05-30 DIAGNOSIS — Z7901 Long term (current) use of anticoagulants: Secondary | ICD-10-CM

## 2024-05-30 DIAGNOSIS — Z952 Presence of prosthetic heart valve: Secondary | ICD-10-CM

## 2024-05-30 DIAGNOSIS — Z5181 Encounter for therapeutic drug level monitoring: Secondary | ICD-10-CM

## 2024-05-30 LAB — POCT INR: INR: 3 (ref 2.0–3.0)

## 2024-05-30 NOTE — Patient Instructions (Signed)
 Continue taking warfarin 3 tablets daily except 2.5 tablets on Wednesday. Continue eating 1 leafy vegetable weekly. Recheck INR in 6 weeks.  Anticoagulation Clinic [phone]

## 2024-05-30 NOTE — Progress Notes (Signed)
 INR 3.0 Please see anticoagulation encounter Continue taking warfarin 3 tablets daily except 2.5 tablets on Wednesday. Continue eating 1 leafy vegetable weekly. Recheck INR in 6 weeks.  Anticoagulation Clinic [phone]

## 2024-06-19 ENCOUNTER — Other Ambulatory Visit (HOSPITAL_COMMUNITY): Payer: Self-pay

## 2024-06-20 ENCOUNTER — Other Ambulatory Visit (HOSPITAL_COMMUNITY): Payer: Self-pay

## 2024-07-06 ENCOUNTER — Other Ambulatory Visit (HOSPITAL_COMMUNITY): Payer: Self-pay

## 2024-07-07 ENCOUNTER — Other Ambulatory Visit (HOSPITAL_COMMUNITY): Payer: Self-pay

## 2024-07-11 ENCOUNTER — Ambulatory Visit: Payer: Self-pay

## 2024-07-11 DIAGNOSIS — Z5181 Encounter for therapeutic drug level monitoring: Secondary | ICD-10-CM

## 2024-07-11 DIAGNOSIS — Z952 Presence of prosthetic heart valve: Secondary | ICD-10-CM

## 2024-07-11 LAB — POCT INR: INR: 2.3 (ref 2.0–3.0)

## 2024-07-11 NOTE — Progress Notes (Signed)
 INR 2.3  Take 4 tablets today only then Continue taking warfarin 3 tablets daily except 2.5 tablets on Wednesday. Continue eating 1 leafy vegetable weekly. Recheck INR in 6 weeks.  [facility] [phone].

## 2024-07-11 NOTE — Patient Instructions (Signed)
 Take 4 tablets today only then Continue taking warfarin 3 tablets daily except 2.5 tablets on Wednesday. Continue eating 1 leafy vegetable weekly. Recheck INR in 6 weeks.  [facility] [phone].

## 2024-08-22 ENCOUNTER — Ambulatory Visit: Payer: Self-pay

## 2024-10-17 ENCOUNTER — Ambulatory Visit (HOSPITAL_COMMUNITY): Payer: Self-pay
